# Patient Record
Sex: Male | Born: 1955 | Race: White | Hispanic: No | Marital: Married | Smoking: Current every day smoker
Health system: Southern US, Community
[De-identification: ages and names within clinical notes are randomized; demographics above are authoritative.]

## PROBLEM LIST (undated history)

## (undated) DIAGNOSIS — M1990 Unspecified osteoarthritis, unspecified site: Secondary | ICD-10-CM

## (undated) DIAGNOSIS — R5383 Other fatigue: Secondary | ICD-10-CM

## (undated) DIAGNOSIS — C801 Malignant (primary) neoplasm, unspecified: Secondary | ICD-10-CM

---

## 1977-01-10 HISTORY — PX: FOOT SURGERY: SHX648

## 1979-01-11 HISTORY — PX: FINGER SURGERY: SHX640

## 1992-01-11 HISTORY — PX: VASECTOMY: SHX75

## 2001-12-14 ENCOUNTER — Emergency Department (HOSPITAL_COMMUNITY): Admission: EM | Admit: 2001-12-14 | Discharge: 2001-12-14 | Payer: Self-pay | Admitting: Emergency Medicine

## 2004-01-11 DIAGNOSIS — Z87442 Personal history of urinary calculi: Secondary | ICD-10-CM

## 2004-01-11 HISTORY — DX: Personal history of urinary calculi: Z87.442

## 2005-04-04 ENCOUNTER — Encounter: Admission: RE | Admit: 2005-04-04 | Discharge: 2005-04-04 | Payer: Self-pay | Admitting: Family Medicine

## 2005-04-07 ENCOUNTER — Encounter: Admission: RE | Admit: 2005-04-07 | Discharge: 2005-04-07 | Payer: Self-pay | Admitting: Family Medicine

## 2007-01-22 ENCOUNTER — Ambulatory Visit: Payer: Self-pay | Admitting: Gastroenterology

## 2007-02-05 ENCOUNTER — Ambulatory Visit: Payer: Self-pay | Admitting: Gastroenterology

## 2007-02-05 ENCOUNTER — Encounter: Payer: Self-pay | Admitting: Gastroenterology

## 2007-04-30 IMAGING — CR DG LUMBAR SPINE COMPLETE 4+V
5 series · 5 of 5 positions shown · non-contrast
Comparison: None.

CLINICAL DATA: Back and bilateral hip pain, worse on the right.  No acute injury.
 LUMBAR SPINE, FOUR VIEWS:

[view not recorded (1 of 5)]
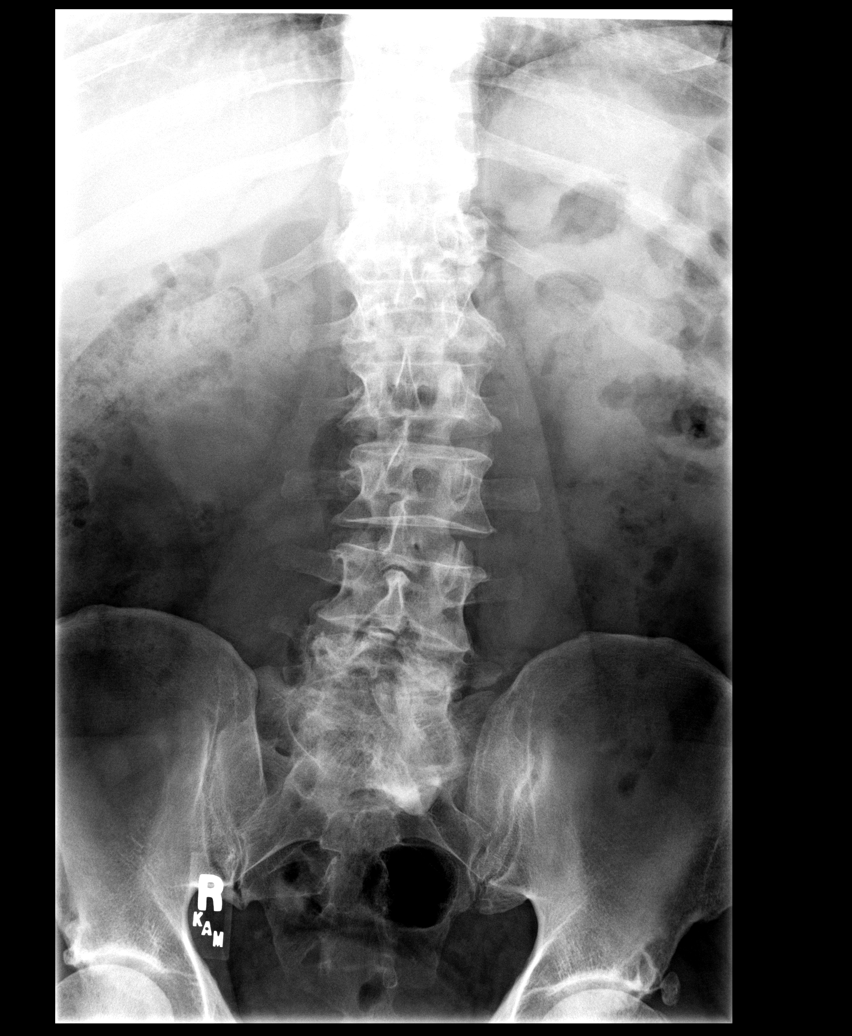

[view not recorded (2 of 5)]
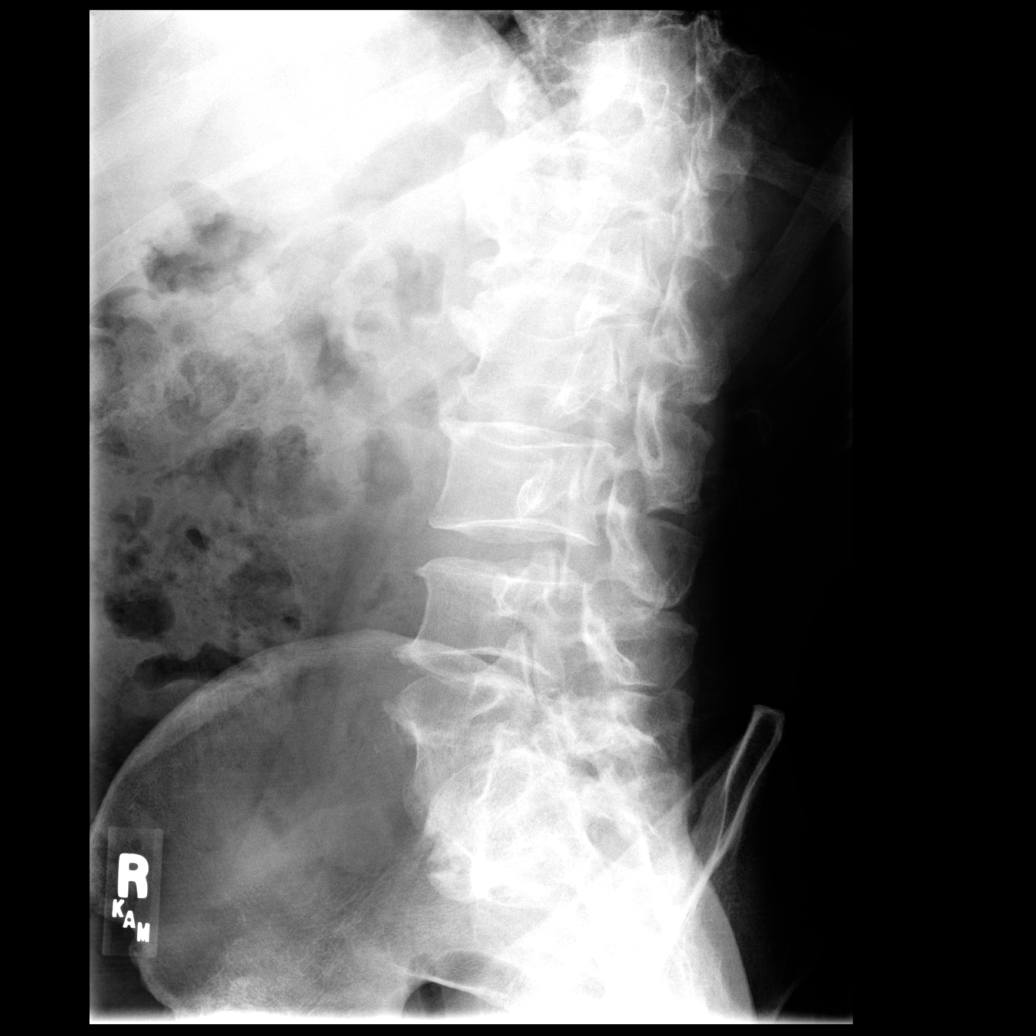

[view not recorded (3 of 5)]
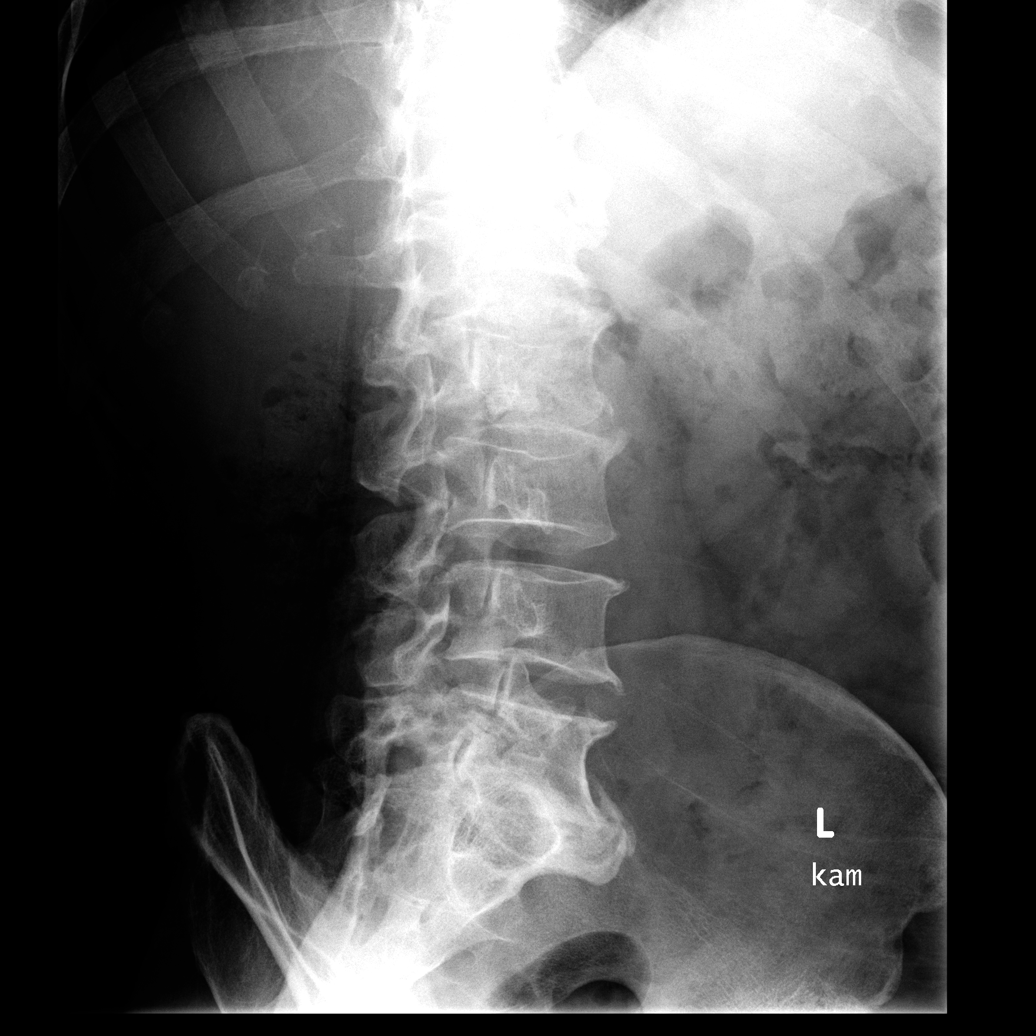

[view not recorded (4 of 5)]
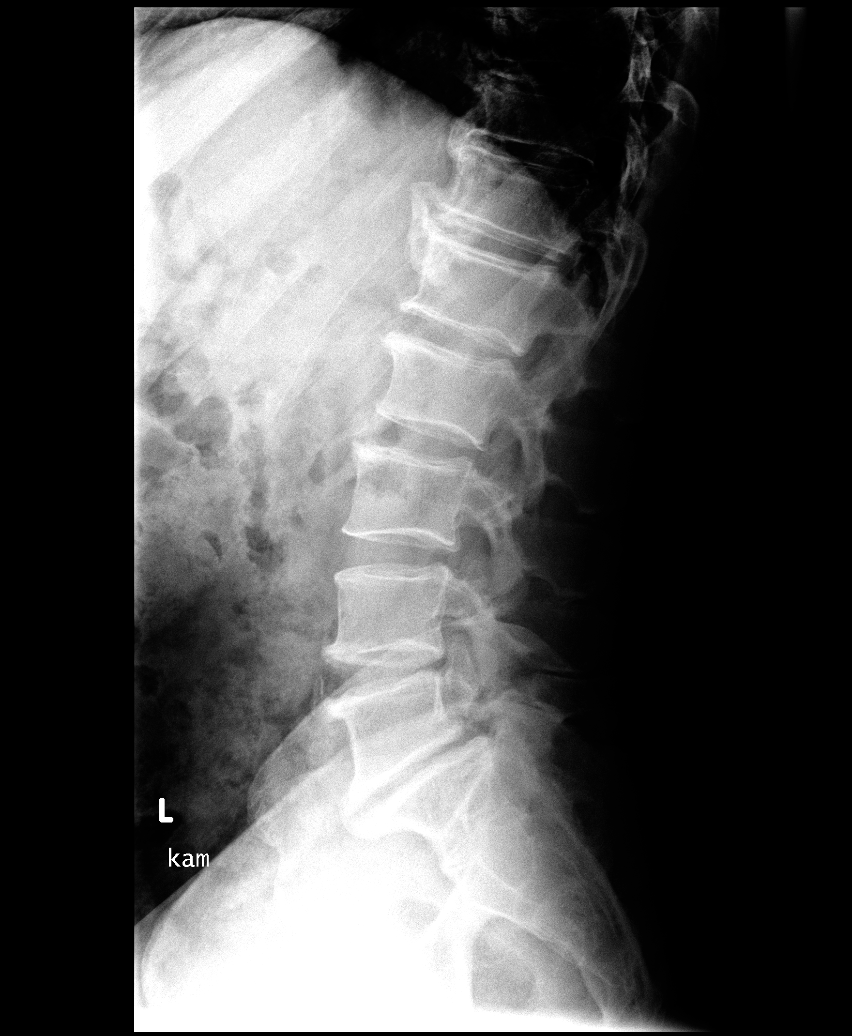

[view not recorded (5 of 5)]
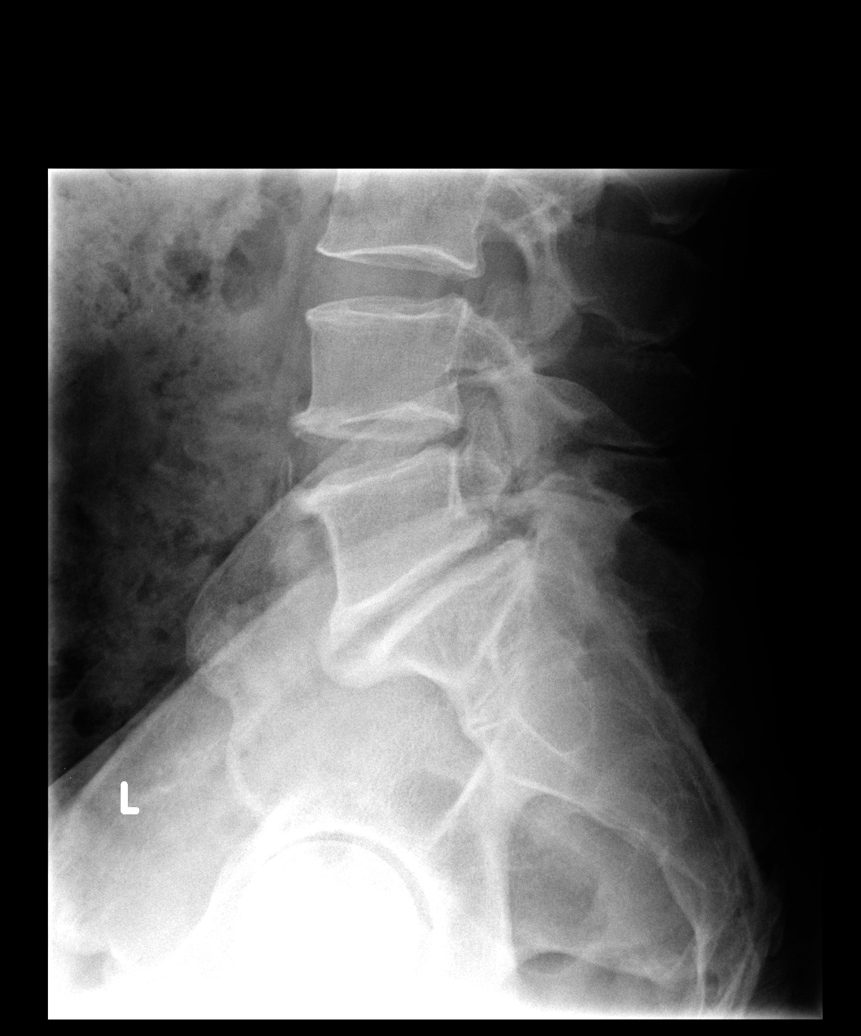

[5 of 5 positions shown; findings below may reference images not displayed]

There are five lumbar type vertebral bodies demonstrating a convex left scoliosis centered at L3.  Bilateral L5 pars defects result in approximately 7 mm of anterolisthesis at L5-S1.  There is disk space loss at that level.  Mild intervertebral spurring is present at the L2-3 and L4-5 levels.  There are paravertebral osteophytes in the lower thoracic spine.  No acute osseous findings are seen.
IMPRESSION: Bilateral L5 pars defects with associated anterolisthesis, as described.  No acute osseous findings.
 RIGHT HIP, TWO VIEWS:
 There is no evidence of acute fracture or dislocation.  Mild joint space loss is present with subchondral cystic change in the superior acetabulum.  Some cystic change is also noted laterally in the femoral neck.  This suggests the possibility of underlying femoral acetabular impingement.
IMPRESSION: No acute findings.  There are degenerative changes suggesting underlying femoral acetabular impingement. 
 LEFT HIP, TWO VIEWS:
 There are similar degenerative changes on the left suggesting femoral acetabular impingement.   An accessory ossicle is noted lateral to the acetabulum.  No acute fracture or dislocation is seen.  There is no evidence of femoral head osteonecrosis.
IMPRESSION: No acute findings.  Early degenerative changes suggesting underlying femoral acetabular impingement.  Consider hip MRI for further evaluation.

## 2007-04-30 IMAGING — CR DG HIP (WITH OR WITHOUT PELVIS) 2-3V*L*
2 series · 2 of 2 positions shown · non-contrast
Comparison: None.

CLINICAL DATA: Back and bilateral hip pain, worse on the right.  No acute injury.
 LUMBAR SPINE, FOUR VIEWS:

[view not recorded (1 of 2)]
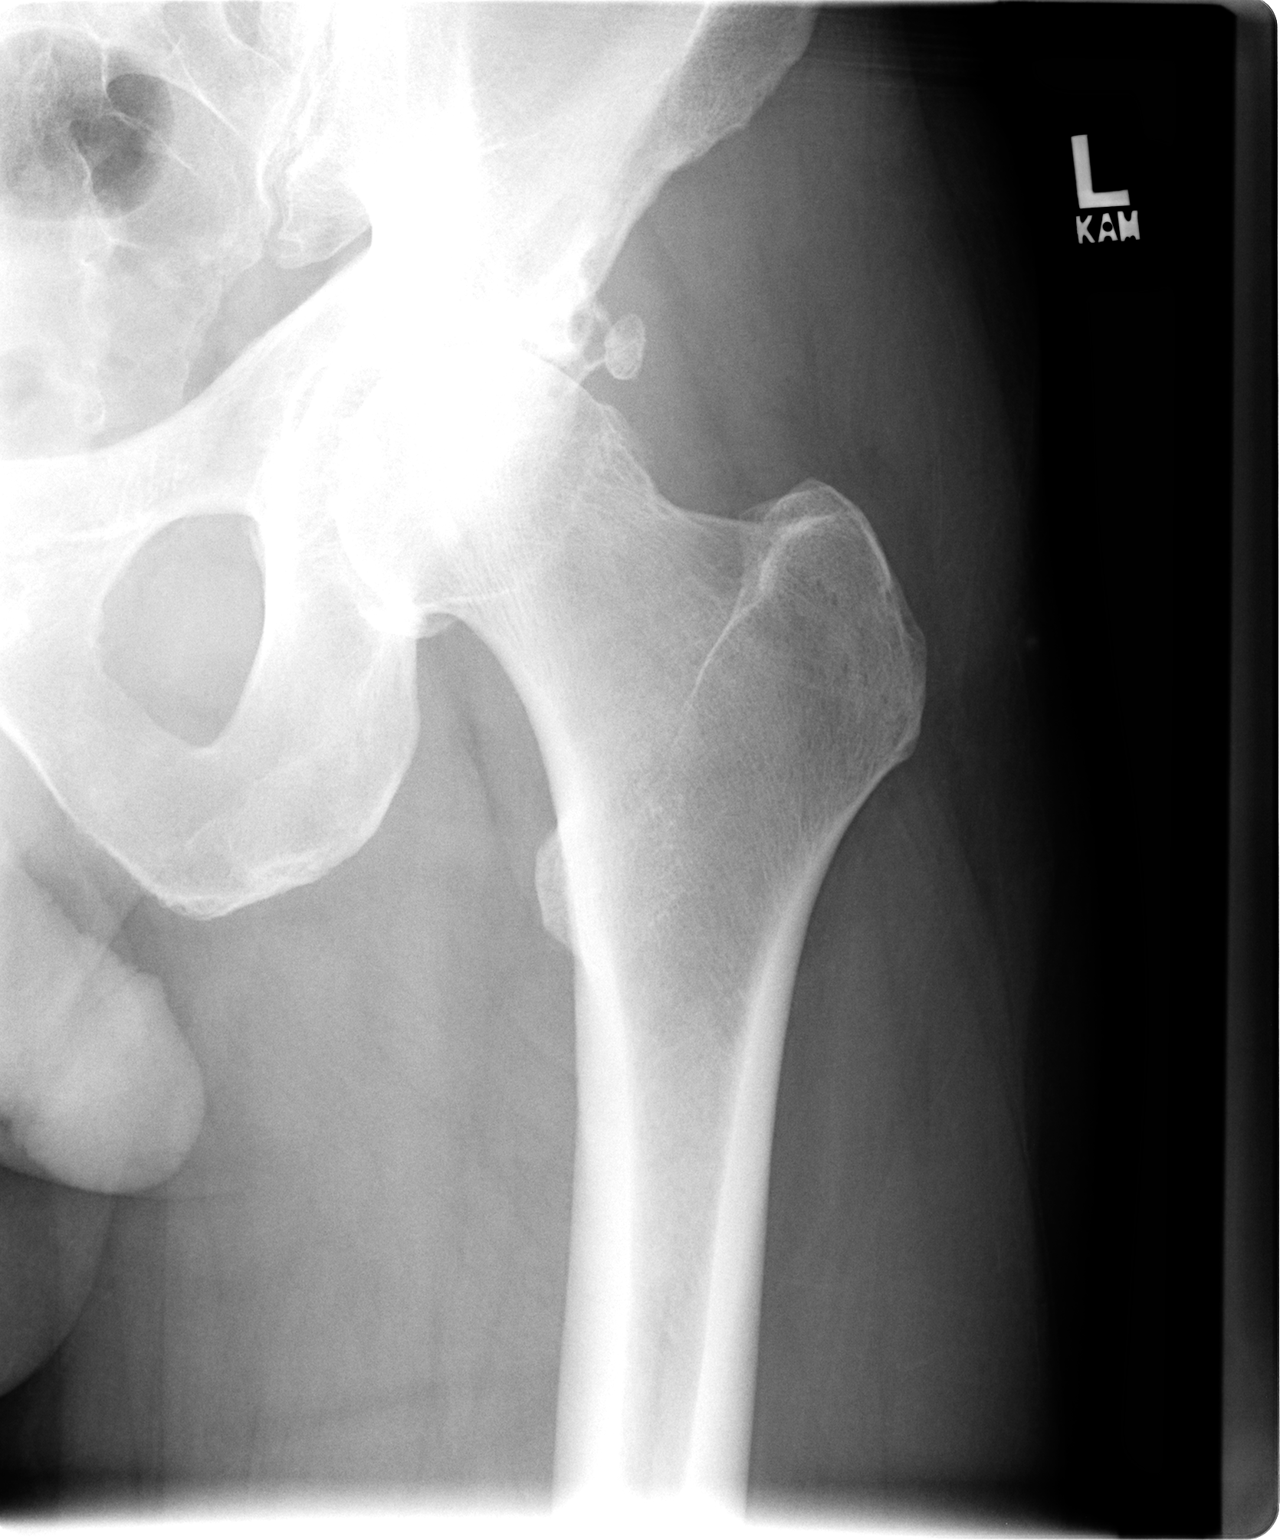

[view not recorded (2 of 2)]
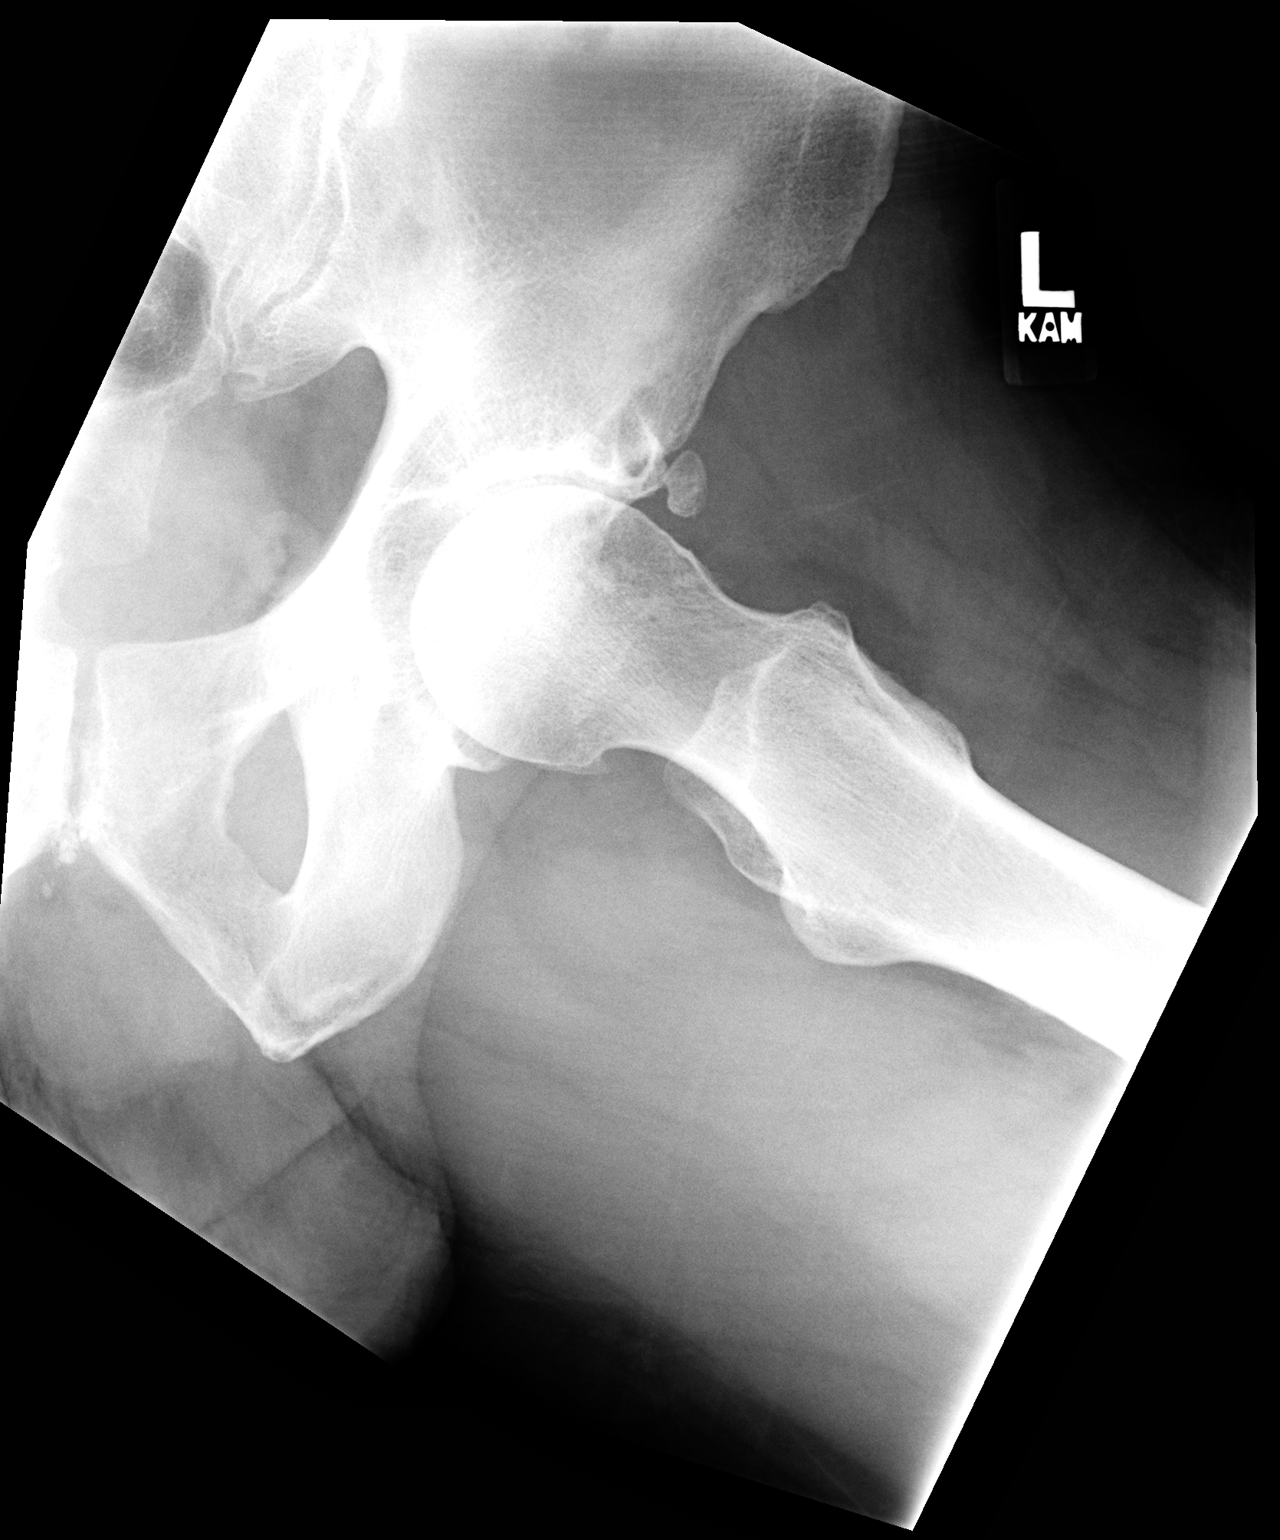

[2 of 2 positions shown; findings below may reference images not displayed]

There are five lumbar type vertebral bodies demonstrating a convex left scoliosis centered at L3.  Bilateral L5 pars defects result in approximately 7 mm of anterolisthesis at L5-S1.  There is disk space loss at that level.  Mild intervertebral spurring is present at the L2-3 and L4-5 levels.  There are paravertebral osteophytes in the lower thoracic spine.  No acute osseous findings are seen.
IMPRESSION: Bilateral L5 pars defects with associated anterolisthesis, as described.  No acute osseous findings.
 RIGHT HIP, TWO VIEWS:
 There is no evidence of acute fracture or dislocation.  Mild joint space loss is present with subchondral cystic change in the superior acetabulum.  Some cystic change is also noted laterally in the femoral neck.  This suggests the possibility of underlying femoral acetabular impingement.
IMPRESSION: No acute findings.  There are degenerative changes suggesting underlying femoral acetabular impingement. 
 LEFT HIP, TWO VIEWS:
 There are similar degenerative changes on the left suggesting femoral acetabular impingement.   An accessory ossicle is noted lateral to the acetabulum.  No acute fracture or dislocation is seen.  There is no evidence of femoral head osteonecrosis.
IMPRESSION: No acute findings.  Early degenerative changes suggesting underlying femoral acetabular impingement.  Consider hip MRI for further evaluation.

## 2011-11-30 ENCOUNTER — Encounter: Payer: Self-pay | Admitting: Gastroenterology

## 2012-09-06 ENCOUNTER — Encounter: Payer: Self-pay | Admitting: Gastroenterology

## 2013-10-09 ENCOUNTER — Encounter: Payer: Self-pay | Admitting: Podiatry

## 2013-10-09 ENCOUNTER — Ambulatory Visit (INDEPENDENT_AMBULATORY_CARE_PROVIDER_SITE_OTHER): Payer: 59 | Admitting: Podiatry

## 2013-10-09 ENCOUNTER — Ambulatory Visit (INDEPENDENT_AMBULATORY_CARE_PROVIDER_SITE_OTHER): Payer: 59

## 2013-10-09 VITALS — BP 147/89 | HR 96 | Resp 12

## 2013-10-09 DIAGNOSIS — R52 Pain, unspecified: Secondary | ICD-10-CM

## 2013-10-09 DIAGNOSIS — M6740 Ganglion, unspecified site: Secondary | ICD-10-CM

## 2013-10-09 NOTE — Progress Notes (Signed)
Subjective:    Patient ID: Lucas Butler, male    DOB: Jan 23, 1955, 58 y.o.   MRN: [id number]  HPI  N-SWOLLEN L-RT TOP OF THE FOOT AND 3RD TOE D-4 WEEKS O-SUDDENLY C-WORSE A-NONE T-NONE  Patient presents for evaluation of soft tissue lesion dorsum of right foot proximally 4-6 weeks duration with slight increase in size and a skin lesion the third right toe present approximately 4-6 weeks without increase in size. There is no previous history or treatment for these problems.  Patient also mentions that in the past he's worn rigid foot orthotics that provided him a generalized relief of lower extremity musculoskeletal pain. More recently he is wearing soft over-the-counter supports with a metatarsal raise that provides some relief of his generalized musculoskeletal pain, however, he believes that the more rigid orthotics may have provided him slightly more relief . He describes the orthotics is rigid, however, the plastic broke and he discolored the orthotics to  Review of Systems  All other systems reviewed and are negative.      Objective:   Physical Exam  Orientated x3 white male  Vascular: DP and PT pulses 2/4 bilaterally  Neurological: Sensation to 10 g monofilament wire intact 5/5 bilaterally Ankle reflex equal and reactive bilaterally  Dermatological: Surgical scars noted on the dorsal first MPJ and second MPJ bilaterally Texture and turgor within normal limits A raise translucent 5 mm skin lesion is noted on the dorsal aspect of the third toe right foot  Musculoskeletal: There is a palpable soft tissue mass on the dorsum of the right foot the third metatarsal area that is not adherent to the overlying skin and is freely mobile. The lesion is approximately 10 by 15 mm. The lesion is noticeable by observation without any edema dorsally, however, is palpable as described as above.  HAV deformities noted bilaterally Medial longitudinal arch noted upon standing  bilaterally Heel inverts when he stands on toes bilaterally There is no restriction ankle, subtalar, midtarsal joints bilaterally  X-ray examination weightbearing right foot  Intact bony structure without fracture and/or dislocation noted  Surgical resection medial condyle of first metatarsal  Metatarsus adductus  HAV deformity  Inferior calcaneal spur  Hammertoe deformity second  No increased soft tissue density noted over dorsum of left foot  Radiographic impression:  No acute bony abnormality noted in the right foot      Assessment & Plan:   Assessment: Satisfactory neurovascular status Well-healed surgical scar scars noted bilaterally Suspect ganglionic cyst dorsal aspect of right foot Mucoid cyst, third toe right foot Generalized musculoskeletal complaints associated with overpronation  Plan: Offered patient Kenalog injection into the suspect ganglionic and cyst on the third right toe and he verbally consents.  The two soft tissue lesions on the right foot are prepped with alcohol and Betadine. The soft tissue lesion on the third metatarsal area and the third right toe were aspirated and injected with Kenalog 10 mg, 10 milligrams a plain Xylocaine and 2.5 mg of plain Marcaine with the majority of the Kenalog into the dorsal third metatarsal soft tissue lesion. There was no obvious reduction in the size of the mass the third right metatarsal after aspiration and injection. Manual compression on the third metatarsal soft tissue lesion seen to reduce the size, however, no fluid was aspirated or herniated in the aspiration puncture site and the soft tissue lesion the third right metatarsal. The soft tissue lesion in the third right toe reduced immediately after aspiration with a clear synovial-like  fluid expressed.  Compression dressings were applied to both sites. I will reevaluate soft tissue lesions in the next 6 weeks to check response from Kenalog injections.  Also, I will  discuss patient's generalized musculoskeletal complaint at his next visit

## 2013-10-09 NOTE — Patient Instructions (Signed)

## 2013-10-10 MED ORDER — TRIAMCINOLONE ACETONIDE 10 MG/ML IJ SUSP
10.0000 mg | Freq: Once | INTRAMUSCULAR | Status: AC
  Administered 2013-10-10: 10 mg

## 2013-11-20 ENCOUNTER — Ambulatory Visit (INDEPENDENT_AMBULATORY_CARE_PROVIDER_SITE_OTHER): Payer: 59 | Admitting: Podiatry

## 2013-11-20 ENCOUNTER — Encounter: Payer: Self-pay | Admitting: Podiatry

## 2013-11-20 VITALS — BP 136/84 | HR 82 | Resp 12

## 2013-11-20 DIAGNOSIS — D492 Neoplasm of unspecified behavior of bone, soft tissue, and skin: Secondary | ICD-10-CM

## 2013-11-20 NOTE — Progress Notes (Signed)
Patient ID: Lucas Butler, male   DOB: 1955/07/28, 58 y.o.   MRN: [id number]  Subjective: This patient presents for follow-up care from the visit of 10/10/2013. Kenalog injections were given the soft tissue lesion on the dorsum of the right foot and third toe right foot. Patient noticed the lesion addressed in the right foot is still present without any pain or increase in size that he noticed.  Objective: Orientated 3 The dorsal aspect of right foot overlying the third metatarsal is a palpable soft tissue lesion 1.5 cm x 2.5 cm that has a rubbery consistency and is not adherent the overlying skin. Patient is able to dorsi and plantarflex toes on the right foot.  The system the distal third right toe is not present  Assessment:  soft tissue tumor dorsal aspect third right metatarsal Resolved slight cyst third right toe  Plan: I had a discussion with patient today make him aware that the soft tissue lesion on the third right metatarsal area was still present and did not respond to Kenalog injection. At this time I discussed treatment options including observation and possible surgical excision. I am recommending that he returns in the next 3 months to further evaluate this lesion, or sooner if he has concern :

## 2014-02-19 ENCOUNTER — Encounter: Payer: Self-pay | Admitting: Podiatry

## 2014-02-19 ENCOUNTER — Ambulatory Visit (INDEPENDENT_AMBULATORY_CARE_PROVIDER_SITE_OTHER): Payer: 59 | Admitting: Podiatry

## 2014-02-19 VITALS — BP 144/91 | HR 88 | Resp 16

## 2014-02-19 DIAGNOSIS — D492 Neoplasm of unspecified behavior of bone, soft tissue, and skin: Secondary | ICD-10-CM

## 2014-02-19 NOTE — Progress Notes (Signed)
Patient ID: Lucas Butler, male   DOB: 1955/09/15, 59 y.o.   MRN: [id number]  Subjective: This patient presents for follow-up visit for a soft tissue lesion on the dorsum of right foot. He initially presented for this problem on 10/09/2013. The lesion has been present since approximately August 2015. A Kenalog injection was given is a soft tissue lesion on the dorsum the right foot on the visit of 10/09/2013. The lesion has not reduced in size since the initial injection.  Objective: Orientated 3 A palpable rubbery soft tissue mass not adherent to the overlying skin measuring 15 x 30 mm on the dorsal bases of the third and fourth toes on the right foot.  Assessment: Persistence of soft tissue mass dorsum of right foot  Plan: At this time as lesion has persistent and has not reduced in size post injection recommending patient consider possible excision and biopsy the lesion. In general I made him aware because he weightbearing occupation time off work was estimated approximately 6- weeks.  He would like to consider possible excision of this lesion and I'm referring him to Dr. Earleen Newport for further evaluation and treatment as Dr. Earleen Newport and Mr. Criswell agreed to

## 2014-02-19 NOTE — Patient Instructions (Signed)
I am referring you to Dr. Earleen Newport to evaluate the soft tissue growth on the right foot and with consideration of possible surgical treatment

## 2014-03-05 ENCOUNTER — Encounter: Payer: Self-pay | Admitting: Podiatry

## 2014-03-05 ENCOUNTER — Ambulatory Visit (INDEPENDENT_AMBULATORY_CARE_PROVIDER_SITE_OTHER): Payer: 59 | Admitting: Podiatry

## 2014-03-05 VITALS — BP 141/83 | HR 95 | Resp 18

## 2014-03-05 DIAGNOSIS — M6740 Ganglion, unspecified site: Secondary | ICD-10-CM

## 2014-03-06 ENCOUNTER — Encounter: Payer: Self-pay | Admitting: Podiatry

## 2014-03-06 NOTE — Progress Notes (Signed)
Patient ID: Lucas Butler, male   DOB: Dec 02, 1955, 59 y.o.   MRN: [id number]  Subjective:  59 year old male presents the office they for surgical consultation of a soft tissue mass on the dorsal aspect of his right foot. He states the mass his been present since October. He states that he has no pain associated with the mass and he states that he does not believe his changed significantly in size. He denies any overlying skin changes. No other complaints at this time. No acute changes since last appointment.  Objective: AAO 3, NAD DP/PT pulses palpable, CRT less than 3 seconds Protective sensation intact with Simms Weinstein monofilament, vibratory sensation intact, Achilles tendon reflex intact. On the dorsal aspect the right foot on the distal fourth metatarsal there is a soft, mobile, fluid-filled soft tissue mass measuring 1.8 x 1.6 cm. The mass appears to be directly underneath the extensor digitorum longus tendon to the fourth toe. There is no overlying erythema or any other skin changes. There is no tender to palpation overlying lesion at this time. No other lesions identified bilaterally. No other areas of tenderness to bilateral lower extremities. MMT 5/5, ROM WNL No open lesions or pre-ulcerative lesions bilaterally. No pain with calf compression, swelling, warmth, erythema.  Assessment: 59 year old male with a symptomatic right foot soft tissue mass, likely ganglion cyst.  Plan: Treatment options both conservative and surgical were discussed with the patient including alternatives, risks, complications. I discussed with the patient surgical incision and biopsy of the lesion which included incision placement as well as postoperative course. At this time the patient states the lesion does not bother him and he is unable to take time off of work for surgery at this time. He wishes to continue to monitor the area for any change. Discussed with him that if the area come symptomatic, increased  in size to call the office. In the meantime, encourage call the office with any questions, concerns, change in symptoms.

## 2014-09-29 ENCOUNTER — Encounter: Payer: Self-pay | Admitting: Gastroenterology

## 2019-01-11 HISTORY — PX: CATARACT EXTRACTION: SUR2

## 2019-09-11 DIAGNOSIS — H11041 Peripheral pterygium, stationary, right eye: Secondary | ICD-10-CM | POA: Diagnosis not present

## 2019-09-11 DIAGNOSIS — H40013 Open angle with borderline findings, low risk, bilateral: Secondary | ICD-10-CM | POA: Diagnosis not present

## 2019-09-11 DIAGNOSIS — H2512 Age-related nuclear cataract, left eye: Secondary | ICD-10-CM | POA: Diagnosis not present

## 2019-09-11 DIAGNOSIS — H2181 Floppy iris syndrome: Secondary | ICD-10-CM | POA: Diagnosis not present

## 2019-12-10 DIAGNOSIS — Z20822 Contact with and (suspected) exposure to covid-19: Secondary | ICD-10-CM | POA: Diagnosis not present

## 2019-12-20 DIAGNOSIS — Z20822 Contact with and (suspected) exposure to covid-19: Secondary | ICD-10-CM | POA: Diagnosis not present

## 2019-12-20 DIAGNOSIS — Z03818 Encounter for observation for suspected exposure to other biological agents ruled out: Secondary | ICD-10-CM | POA: Diagnosis not present

## 2020-01-09 DIAGNOSIS — Z20822 Contact with and (suspected) exposure to covid-19: Secondary | ICD-10-CM | POA: Diagnosis not present

## 2020-07-08 DIAGNOSIS — C4441 Basal cell carcinoma of skin of scalp and neck: Secondary | ICD-10-CM | POA: Diagnosis not present

## 2020-07-08 DIAGNOSIS — D1801 Hemangioma of skin and subcutaneous tissue: Secondary | ICD-10-CM | POA: Diagnosis not present

## 2020-07-08 DIAGNOSIS — D225 Melanocytic nevi of trunk: Secondary | ICD-10-CM | POA: Diagnosis not present

## 2020-07-08 DIAGNOSIS — L570 Actinic keratosis: Secondary | ICD-10-CM | POA: Diagnosis not present

## 2020-07-08 DIAGNOSIS — L821 Other seborrheic keratosis: Secondary | ICD-10-CM | POA: Diagnosis not present

## 2020-07-08 DIAGNOSIS — L720 Epidermal cyst: Secondary | ICD-10-CM | POA: Diagnosis not present

## 2020-07-08 DIAGNOSIS — D2261 Melanocytic nevi of right upper limb, including shoulder: Secondary | ICD-10-CM | POA: Diagnosis not present

## 2020-08-05 DIAGNOSIS — C44319 Basal cell carcinoma of skin of other parts of face: Secondary | ICD-10-CM | POA: Diagnosis not present

## 2020-09-16 DIAGNOSIS — H2181 Floppy iris syndrome: Secondary | ICD-10-CM | POA: Diagnosis not present

## 2020-09-16 DIAGNOSIS — H2512 Age-related nuclear cataract, left eye: Secondary | ICD-10-CM | POA: Diagnosis not present

## 2020-09-16 DIAGNOSIS — H11041 Peripheral pterygium, stationary, right eye: Secondary | ICD-10-CM | POA: Diagnosis not present

## 2020-09-16 DIAGNOSIS — H40003 Preglaucoma, unspecified, bilateral: Secondary | ICD-10-CM | POA: Diagnosis not present

## 2020-09-29 DIAGNOSIS — C44321 Squamous cell carcinoma of skin of nose: Secondary | ICD-10-CM | POA: Diagnosis not present

## 2022-04-05 ENCOUNTER — Ambulatory Visit (HOSPITAL_COMMUNITY): Payer: Self-pay | Admitting: Emergency Medicine

## 2022-04-05 DIAGNOSIS — G8929 Other chronic pain: Secondary | ICD-10-CM

## 2022-04-05 NOTE — Patient Instructions (Addendum)
DUE TO COVID-19 ONLY TWO VISITORS  (aged 68 and older)  ARE ALLOWED TO COME WITH YOU AND STAY IN THE WAITING ROOM ONLY DURING PRE OP AND PROCEDURE.   **NO VISITORS ARE ALLOWED IN THE SHORT STAY AREA OR RECOVERY ROOM!!**  IF YOU WILL BE ADMITTED INTO THE HOSPITAL YOU ARE ALLOWED ONLY FOUR SUPPORT PEOPLE DURING VISITATION HOURS ONLY (7 AM -8PM)   The support person(s) must pass our screening, gel in and out, and wear a mask at all times, including in the patient's room. Patients must also wear a mask when staff or their support person are in the room. Visitors GUEST BADGE MUST BE WORN VISIBLY  One adult visitor may remain with you overnight and MUST be in the room by 8 P.M.     Your procedure is scheduled on: 4./15/24   Report to [facility] Entrance    Report to admitting at  5:15 AM   Call this number if you have problems the morning of surgery [phone]   Do not eat food :After Midnight.   After Midnight you may have the following liquids until _4:15_____ AM/  DAY OF SURGERY  Water Black Coffee (sugar ok, NO MILK/CREAM OR CREAMERS)  Tea (sugar ok, NO MILK/CREAM OR CREAMERS) regular and decaf                             Plain Jell-O (NO RED)                                           Fruit ices (not with fruit pulp, NO RED)                                     Popsicles (NO RED)                                                                  Juice: apple, WHITE grape, WHITE cranberry Sports drinks like Gatorade (NO RED)                   The day of surgery:  Drink ONE (1) Pre-Surgery Clear Ensure at  4:00 AM the morning of surgery. Drink in one sitting. Do not sip.  This drink was given to you during your hospital  pre-op appointment visit. Nothing else to drink after completing the  Pre-Surgery Clear Ensure at 4:15 AM          If you have questions, please contact your surgeon's office.      Oral Hygiene is also important to reduce your risk of infection.                                     Remember - BRUSH YOUR TEETH THE MORNING OF SURGERY WITH YOUR REGULAR TOOTHPASTE  DENTURES WILL BE REMOVED PRIOR TO SURGERY PLEASE DO NOT APPLY "Poly grip" OR ADHESIVES!!!      Take these medicines the  morning of surgery with A SIP OF WATER: none                                You may not have any metal on your body including  jewelry, and body piercing             Do not wear  lotions, powders, cologne, or deodorant                Men may shave face and neck.   Do not bring valuables to the hospital. [place].   Contacts, glasses, or bridgework may not be worn into surgery.   DO NOT [place].   Patients discharged on the day of surgery will not be allowed to drive home.  Someone NEEDS to stay with you for the first 24 hours after anesthesia.   Special Instructions: Bring a copy of your healthcare power of attorney and living will documents   the day of surgery if you haven't scanned them before.              Please read over the following fact sheets you were given: IF YOU HAVE QUESTIONS ABOUT YOUR PRE-OP INSTRUCTIONS PLEASE CALL [phone]    [facility] - Preparing for Surgery Before surgery, you can play an important role.  Because skin is not sterile, your skin needs to be as free of germs as possible.  You can reduce the number of germs on your skin by washing with CHG (chlorahexidine gluconate) soap before surgery.  CHG is an antiseptic cleaner which kills germs and bonds with the skin to continue killing germs even after washing. Please DO NOT use if you have an allergy to CHG or antibacterial soaps.  If your skin becomes reddened/irritated stop using the CHG and inform your nurse when you arrive at Short Stay..  You may shave your face/neck. Please follow these instructions carefully:  1.  Shower with CHG Soap  3 days before surgery ,the night before  surgery and the  morning of Surgery.  2.  If you choose to wash your hair, wash your hair first as usual with your  normal  shampoo.  3.  After you shampoo, rinse your hair and body thoroughly to remove the  shampoo.                            4.  Use CHG as you would any other liquid soap.  You can apply chg directly  to the skin and wash                       Gently with a scrungie or clean washcloth.  5.  Apply the CHG Soap to your body ONLY FROM THE NECK DOWN.   Do not use on face/ open                           Wound or open sores. Avoid contact with eyes, ears mouth and genitals (private parts).                       Wash face,  Genitals (private parts) with your normal soap.  6.  Wash thoroughly, paying special attention to the area where your surgery  will be performed.  7.  Thoroughly rinse your body with warm water from the neck down.  8.  DO NOT shower/wash with your normal soap after using and rinsing off  the CHG Soap.             9.  Pat yourself dry with a clean towel.            10.  Wear clean pajamas.            11.  Place clean sheets on your bed the night of your first shower and do not  sleep with pets. Day of Surgery : Do not apply any lotions/deodorants the morning of surgery.  Please wear clean clothes to the hospital/surgery center.  FAILURE TO FOLLOW THESE INSTRUCTIONS MAY RESULT IN THE CANCELLATION OF YOUR SURGERY  PATIENT SIGNATURE_________________________________    ________________________________________________________________________  Adam [place]ix  An incentive spirometer is a tool that can help keep your lungs clear and active. This tool measures how well you are filling your lungs with each breath. Taking long deep breaths may help reverse or decrease the chance of developing breathing (pulmonary) problems (especially infection) following: A long period of time when you are unable to move or be active. BEFORE THE PROCEDURE  If the  spirometer includes an indicator to show your best effort, your nurse or respiratory therapist will set it to a desired goal. If possible, sit up straight or lean slightly forward. Try not to slouch. Hold the incentive spirometer in an upright position. INSTRUCTIONS FOR USE  Sit on the edge of your bed if possible, or sit up as far as you can in bed or on a chair. Hold the incentive spirometer in an upright position. Breathe out normally. Place the mouthpiece in your mouth and seal your lips tightly around it. Breathe in slowly and as deeply as possible, raising the piston or the ball toward the top of the column. Hold your breath for 3-5 seconds or for as long as possible. Allow the piston or ball to fall to the bottom of the column. Remove the mouthpiece from your mouth and breathe out normally. Rest for a few seconds and repeat Steps 1 through 7 at least 10 times every 1-2 hours when you are awake. Take your time and take a few normal breaths between deep breaths. The spirometer may include an indicator to show your best effort. Use the indicator as a goal to work toward during each repetition. After each set of 10 deep breaths, practice coughing to be sure your lungs are clear. If you have an incision (the cut made at the time of surgery), support your incision when coughing by placing a pillow or rolled up towels firmly against it. Once you are able to get out of bed, walk around indoors and cough well. You may stop using the incentive spirometer when instructed by your caregiver.  RISKS AND COMPLICATIONS Take your time so you do not get dizzy or light-headed. If you are in pain, you may need to take or ask for pain medication before doing incentive spirometry. It is harder to take a deep breath if you are having pain. AFTER USE Rest and breathe slowly and easily. It can be helpful to keep track of a log of your progress. Your caregiver can provide you with a simple table to help with  this. If you are using the spirometer at  home, follow these instructions: [place] IF:  You are having difficultly using the spirometer. You have trouble using the spirometer as often as instructed. Your pain medication is not giving enough relief while using the spirometer. You develop fever of 100.5 F (38.1 C) or higher. SEEK IMMEDIATE MEDICAL CARE IF:  You cough up bloody sputum that had not been present before. You develop fever of 102 F (38.9 C) or greater. You develop worsening pain at or near the incision site. MAKE SURE YOU:  Understand these instructions. Will watch your condition. Will get help right away if you are not doing well or get worse. Document Released: 05/09/2006 Document Revised: 03/21/2011 Document Reviewed: 07/10/2006 [facility] Patient Information 2014 [place], [place].   ________________________________________________________________________

## 2022-04-05 NOTE — H&P (View-Only) (Signed)
TOTAL HIP ADMISSION H&P  Patient is admitted for right total hip arthroplasty.  Subjective:  Chief Complaint: right hip pain  HPI: Lucas Butler, 67 y.o. male, has a history of pain and functional disability in the right hip(s) due to arthritis and patient has failed non-surgical conservative treatments for greater than 12 weeks to include NSAID's and/or analgesics, flexibility and strengthening excercises, use of assistive devices, and activity modification.  Onset of symptoms was gradual starting >10 years ago with gradually worsening course since that time.The patient noted no past surgery on the right hip(s).  Patient currently rates pain in the right hip at 8 out of 10 with activity. Patient has night pain, worsening of pain with activity and weight bearing, and pain that interfers with activities of daily living. There is no current active infection.  There are no problems to display for this patient.  No past medical history on file.  No past surgical history on file.  No current outpatient medications on file.   No current facility-administered medications for this visit.   No Known Allergies  Social History   Tobacco Use   Smoking status: Every Day   Smokeless tobacco: Not on file  Substance Use Topics   Alcohol use: Yes    No family history on file.   Review of Systems  Musculoskeletal:  Positive for arthralgias.  All other systems reviewed and are negative.   Objective:  Physical Exam Constitutional:      General: He is not in acute distress.    Appearance: Normal appearance. He is normal weight.  HENT:     Head: Normocephalic and atraumatic.  Eyes:     Extraocular Movements: Extraocular movements intact.     Conjunctiva/sclera: Conjunctivae normal.     Pupils: Pupils are equal, round, and reactive to light.  Cardiovascular:     Rate and Rhythm: Normal rate and regular rhythm.     Pulses: Normal pulses.     Heart sounds: Normal heart sounds.  Pulmonary:      Effort: Pulmonary effort is normal. No respiratory distress.     Breath sounds: Normal breath sounds.  Abdominal:     General: Bowel sounds are normal. There is no distension.     Palpations: Abdomen is soft.     Tenderness: There is no abdominal tenderness.  Musculoskeletal:        General: Tenderness present.     Cervical back: Normal range of motion and neck supple.     Comments: Right hip with bony tenderness and tenderness elicited with ROM.  Limited internal and external rotation.  Without bony deformity or malrotation.  No significant swelling or warmth to joint.  Lymphadenopathy:     Cervical: No cervical adenopathy.  Skin:    General: Skin is warm and dry.     Capillary Refill: Capillary refill takes less than 2 seconds.     Findings: No erythema or rash.  Neurological:     General: No focal deficit present.     Mental Status: He is alert and oriented to person, place, and time.  Psychiatric:        Mood and Affect: Mood normal.        Behavior: Behavior normal.     Vital signs in last 24 hours: @VSRANGES@  Labs:   There is no height or weight on file to calculate BMI.   Imaging Review Plain radiographs demonstrate severe degenerative joint disease of the bilateral hip(s). The bone quality appears to be   good for age and reported activity level.    Assessment/Plan:  End stage arthritis, right hip(s)  The patient history, physical examination, clinical judgement of the provider and imaging studies are consistent with end stage degenerative joint disease of the right hip(s) and total hip arthroplasty is deemed medically necessary. The treatment options including medical management, injection therapy, arthroscopy and arthroplasty were discussed at length. The risks and benefits of total hip arthroplasty were presented and reviewed. The risks due to aseptic loosening, infection, stiffness, dislocation/subluxation,  thromboembolic complications and other imponderables  were discussed.  The patient acknowledged the explanation, agreed to proceed with the plan and consent was signed. Patient is being admitted for inpatient treatment for surgery, pain control, PT, OT, prophylactic antibiotics, VTE prophylaxis, progressive ambulation and ADL's and discharge planning.The patient is planning to be discharged home with outpatient PT  

## 2022-04-05 NOTE — H&P (Signed)
TOTAL HIP ADMISSION H&P  Patient is admitted for right total hip arthroplasty.  Subjective:  Chief Complaint: right hip pain  HPI: Lucas Butler, 67 y.o. male, has a history of pain and functional disability in the right hip(s) due to arthritis and patient has failed non-surgical conservative treatments for greater than 12 weeks to include NSAID's and/or analgesics, flexibility and strengthening excercises, use of assistive devices, and activity modification.  Onset of symptoms was gradual starting >10 years ago with gradually worsening course since that time.The patient noted no past surgery on the right hip(s).  Patient currently rates pain in the right hip at 8 out of 10 with activity. Patient has night pain, worsening of pain with activity and weight bearing, and pain that interfers with activities of daily living. There is no current active infection.  There are no problems to display for this patient.  No past medical history on file.  No past surgical history on file.  No current outpatient medications on file.   No current facility-administered medications for this visit.   No Known Allergies  Social History   Tobacco Use   Smoking status: Every Day   Smokeless tobacco: Not on file  Substance Use Topics   Alcohol use: Yes    No family history on file.   Review of Systems  Musculoskeletal:  Positive for arthralgias.  All other systems reviewed and are negative.   Objective:  Physical Exam Constitutional:      General: He is not in acute distress.    Appearance: Normal appearance. He is normal weight.  HENT:     Head: Normocephalic and atraumatic.  Eyes:     Extraocular Movements: Extraocular movements intact.     Conjunctiva/sclera: Conjunctivae normal.     Pupils: Pupils are equal, round, and reactive to light.  Cardiovascular:     Rate and Rhythm: Normal rate and regular rhythm.     Pulses: Normal pulses.     Heart sounds: Normal heart sounds.  Pulmonary:      Effort: Pulmonary effort is normal. No respiratory distress.     Breath sounds: Normal breath sounds.  Abdominal:     General: Bowel sounds are normal. There is no distension.     Palpations: Abdomen is soft.     Tenderness: There is no abdominal tenderness.  Musculoskeletal:        General: Tenderness present.     Cervical back: Normal range of motion and neck supple.     Comments: Right hip with bony tenderness and tenderness elicited with ROM.  Limited internal and external rotation.  Without bony deformity or malrotation.  No significant swelling or warmth to joint.  Lymphadenopathy:     Cervical: No cervical adenopathy.  Skin:    General: Skin is warm and dry.     Capillary Refill: Capillary refill takes less than 2 seconds.     Findings: No erythema or rash.  Neurological:     General: No focal deficit present.     Mental Status: He is alert and oriented to person, place, and time.  Psychiatric:        Mood and Affect: Mood normal.        Behavior: Behavior normal.     Vital signs in last 24 hours: @VSRANGES @  Labs:   There is no height or weight on file to calculate BMI.   Imaging Review Plain radiographs demonstrate severe degenerative joint disease of the bilateral hip(s). The bone quality appears to be  good for age and reported activity level.    Assessment/Plan:  End stage arthritis, right hip(s)  The patient history, physical examination, clinical judgement of the provider and imaging studies are consistent with end stage degenerative joint disease of the right hip(s) and total hip arthroplasty is deemed medically necessary. The treatment options including medical management, injection therapy, arthroscopy and arthroplasty were discussed at length. The risks and benefits of total hip arthroplasty were presented and reviewed. The risks due to aseptic loosening, infection, stiffness, dislocation/subluxation,  thromboembolic complications and other imponderables  were discussed.  The patient acknowledged the explanation, agreed to proceed with the plan and consent was signed. Patient is being admitted for inpatient treatment for surgery, pain control, PT, OT, prophylactic antibiotics, VTE prophylaxis, progressive ambulation and ADL's and discharge planning.The patient is planning to be discharged home with outpatient PT

## 2022-04-13 ENCOUNTER — Encounter (HOSPITAL_COMMUNITY): Payer: Self-pay

## 2022-04-13 ENCOUNTER — Other Ambulatory Visit: Payer: Self-pay

## 2022-04-13 ENCOUNTER — Encounter (HOSPITAL_COMMUNITY)
Admission: RE | Admit: 2022-04-13 | Discharge: 2022-04-13 | Disposition: A | Discharge status: Home / Self Care | Payer: Medicare Other | Source: Ambulatory Visit | Attending: Orthopedic Surgery | Admitting: Orthopedic Surgery

## 2022-04-13 DIAGNOSIS — M25551 Pain in right hip: Secondary | ICD-10-CM | POA: Diagnosis not present

## 2022-04-13 DIAGNOSIS — G8929 Other chronic pain: Secondary | ICD-10-CM

## 2022-04-13 DIAGNOSIS — Z01818 Encounter for other preprocedural examination: Secondary | ICD-10-CM

## 2022-04-13 DIAGNOSIS — Z01812 Encounter for preprocedural laboratory examination: Secondary | ICD-10-CM | POA: Insufficient documentation

## 2022-04-13 HISTORY — DX: Other fatigue: R53.83

## 2022-04-13 HISTORY — DX: Unspecified osteoarthritis, unspecified site: M19.90

## 2022-04-13 LAB — CBC WITH DIFFERENTIAL/PLATELET
Abs Immature Granulocytes: 0.01 10*3/uL (ref 0.00–0.07)
Basophils Absolute: 0 10*3/uL (ref 0.0–0.1)
Basophils Relative: 1 %
Eosinophils Absolute: 0.1 10*3/uL (ref 0.0–0.5)
Eosinophils Relative: 1 %
HCT: 44.1 % (ref 39.0–52.0)
Hemoglobin: 14.5 g/dL (ref 13.0–17.0)
Immature Granulocytes: 0 %
Lymphocytes Relative: 23 %
Lymphs Abs: 1.6 10*3/uL (ref 0.7–4.0)
MCH: 30.3 pg (ref 26.0–34.0)
MCHC: 32.9 g/dL (ref 30.0–36.0)
MCV: 92.1 fL (ref 80.0–100.0)
Monocytes Absolute: 0.5 10*3/uL (ref 0.1–1.0)
Monocytes Relative: 6 %
Neutro Abs: 4.9 10*3/uL (ref 1.7–7.7)
Neutrophils Relative %: 69 %
Platelets: 210 10*3/uL (ref 150–400)
RBC: 4.79 MIL/uL (ref 4.22–5.81)
RDW: 13.4 % (ref 11.5–15.5)
WBC: 7.2 10*3/uL (ref 4.0–10.5)
nRBC: 0 % (ref 0.0–0.2)

## 2022-04-13 LAB — TYPE AND SCREEN
ABO/RH(D): A NEG
Antibody Screen: NEGATIVE

## 2022-04-13 LAB — SURGICAL PCR SCREEN
MRSA, PCR: NEGATIVE
Staphylococcus aureus: NEGATIVE

## 2022-04-13 LAB — COMPREHENSIVE METABOLIC PANEL
ALT: 14 U/L (ref 0–44)
AST: 14 U/L — ABNORMAL LOW (ref 15–41)
Albumin: 4 g/dL (ref 3.5–5.0)
Alkaline Phosphatase: 65 U/L (ref 38–126)
Anion gap: 7 (ref 5–15)
BUN: 12 mg/dL (ref 8–23)
CO2: 25 mmol/L (ref 22–32)
Calcium: 9 mg/dL (ref 8.9–10.3)
Chloride: 106 mmol/L (ref 98–111)
Creatinine, Ser: 0.89 mg/dL (ref 0.61–1.24)
GFR, Estimated: >60 mL/min (ref >60)
Glucose, Bld: 87 mg/dL (ref 70–99)
Potassium: 4.2 mmol/L (ref 3.5–5.1)
Sodium: 138 mmol/L (ref 135–145)
Total Bilirubin: 0.4 mg/dL (ref 0.3–1.2)
Total Protein: 6.7 g/dL (ref 6.5–8.1)

## 2022-04-13 NOTE — Progress Notes (Signed)
Anesthesia note:   PCP - Dr. Roselle Locus Cardiologist -none Other-   Chest x-ray - no EKG - 03/17/22-chart Stress Test - no ECHO - no Cardiac Cath - no CABG-no Pacemaker/ICD device last checked:no  Sleep Study - no CPAP -   Pt is pre diabetic-no CBG at PAT visit- Fasting Blood Sugar at home- Checks Blood Sugar _____  Blood Thinner:no. Uses  goody powders and will stop 4/7 Blood Thinner Instructions: Aspirin Instructions: Last Dose:  Anesthesia review: /No  reason:  Patient denies shortness of breath, fever, cough and chest pain at PAT appointment. Pt has no SOB with activities. He is very anxious about this surgery.    Patient verbalized understanding of instructions that were given to them at the PAT appointment. Patient was also instructed that they will need to review over the PAT instructions again at home before surgery.yes

## 2022-04-24 NOTE — Anesthesia Preprocedure Evaluation (Signed)
Anesthesia Evaluation  Patient identified by MRN, date of birth, ID band Patient awake    Reviewed: Allergy & Precautions, H&P , NPO status , Patient's Chart, lab work & pertinent test results  Airway Mallampati: I  TM Distance: >3 FB Neck ROM: Full    Dental no notable dental hx. (+) Teeth Intact, Dental Advisory Given   Pulmonary Current Smoker and Patient abstained from smoking.   Pulmonary exam normal breath sounds clear to auscultation       Cardiovascular Exercise Tolerance: Good negative cardio ROS Normal cardiovascular exam Rhythm:Regular Rate:Normal     Neuro/Psych negative neurological ROS  negative psych ROS   GI/Hepatic negative GI ROS, Neg liver ROS,,,  Endo/Other  negative endocrine ROS    Renal/GU negative Renal ROS  negative genitourinary   Musculoskeletal negative musculoskeletal ROS (+) Arthritis , Osteoarthritis,    Abdominal   Peds negative pediatric ROS (+)  Hematology negative hematology ROS (+)   Anesthesia Other Findings   Reproductive/Obstetrics negative OB ROS                              Anesthesia Physical Anesthesia Plan  ASA: 2  Anesthesia Plan: MAC and Spinal   Post-op Pain Management: Tylenol PO (pre-op)* and Celebrex PO (pre-op)*   Induction: Intravenous  PONV Risk Score and Plan: 1 and Propofol infusion and Treatment may vary due to age or medical condition  Airway Management Planned: Natural Airway and Simple Face Mask  Additional Equipment: None  Intra-op Plan:   Post-operative Plan:   Informed Consent: I have reviewed the patients History and Physical, chart, labs and discussed the procedure including the risks, benefits and alternatives for the proposed anesthesia with the patient or authorized representative who has indicated his/her understanding and acceptance.       Plan Discussed with: Anesthesiologist and CRNA  Anesthesia  Plan Comments: (  )        Anesthesia Quick Evaluation

## 2022-04-25 ENCOUNTER — Encounter (HOSPITAL_COMMUNITY)
Admission: RE | Disposition: A | Discharge status: Home / Self Care | Payer: Self-pay | Source: Ambulatory Visit | Attending: Orthopedic Surgery

## 2022-04-25 ENCOUNTER — Encounter (HOSPITAL_COMMUNITY): Payer: Self-pay | Admitting: Orthopedic Surgery

## 2022-04-25 ENCOUNTER — Ambulatory Visit (HOSPITAL_COMMUNITY): Payer: Medicare Other

## 2022-04-25 ENCOUNTER — Ambulatory Visit (HOSPITAL_COMMUNITY): Payer: Medicare Other | Admitting: Anesthesiology

## 2022-04-25 ENCOUNTER — Other Ambulatory Visit: Payer: Self-pay

## 2022-04-25 ENCOUNTER — Ambulatory Visit (HOSPITAL_BASED_OUTPATIENT_CLINIC_OR_DEPARTMENT_OTHER): Payer: Medicare Other | Admitting: Anesthesiology

## 2022-04-25 ENCOUNTER — Ambulatory Visit (HOSPITAL_COMMUNITY)
Admission: RE | Admit: 2022-04-25 | Discharge: 2022-04-25 | Disposition: A | Discharge status: Home / Self Care | Payer: Medicare Other | Source: Ambulatory Visit | Attending: Orthopedic Surgery | Admitting: Orthopedic Surgery

## 2022-04-25 DIAGNOSIS — M1612 Unilateral primary osteoarthritis, left hip: Secondary | ICD-10-CM | POA: Diagnosis not present

## 2022-04-25 DIAGNOSIS — M1611 Unilateral primary osteoarthritis, right hip: Secondary | ICD-10-CM | POA: Insufficient documentation

## 2022-04-25 DIAGNOSIS — Z01818 Encounter for other preprocedural examination: Secondary | ICD-10-CM

## 2022-04-25 DIAGNOSIS — F17200 Nicotine dependence, unspecified, uncomplicated: Secondary | ICD-10-CM | POA: Insufficient documentation

## 2022-04-25 HISTORY — PX: TOTAL HIP ARTHROPLASTY: SHX124

## 2022-04-25 LAB — ABO/RH: ABO/RH(D): A NEG

## 2022-04-25 SURGERY — ARTHROPLASTY, HIP, TOTAL,POSTERIOR APPROACH
Anesthesia: Monitor Anesthesia Care | Site: Hip | Laterality: Right

## 2022-04-25 MED ORDER — TRANEXAMIC ACID-NACL 1000-0.7 MG/100ML-% IV SOLN
1000.0000 mg | INTRAVENOUS | Status: AC
  Administered 2022-04-25: 1000 mg via INTRAVENOUS
  Filled 2022-04-25: qty 100

## 2022-04-25 MED ORDER — ASPIRIN 81 MG PO TBEC: 81.0000 mg | DELAYED_RELEASE_TABLET | Freq: Two times a day (BID) | ORAL | 0 refills | Status: AC

## 2022-04-25 MED ORDER — SODIUM CHLORIDE (PF) 0.9 % IJ SOLN
INTRAMUSCULAR | Status: DC | PRN
  Administered 2022-04-25: 60 mL

## 2022-04-25 MED ORDER — FENTANYL CITRATE PF 50 MCG/ML IJ SOSY: 25.0000 ug | PREFILLED_SYRINGE | INTRAMUSCULAR | Status: DC | PRN

## 2022-04-25 MED ORDER — ACETAMINOPHEN 325 MG PO TABS: 325.0000 mg | ORAL_TABLET | ORAL | Status: DC | PRN

## 2022-04-25 MED ORDER — METHOCARBAMOL 500 MG IVPB - SIMPLE MED
INTRAVENOUS | Status: AC
  Filled 2022-04-25: qty 55

## 2022-04-25 MED ORDER — SODIUM CHLORIDE 0.9 % IV SOLN: INTRAVENOUS | Status: DC

## 2022-04-25 MED ORDER — SODIUM CHLORIDE (PF) 0.9 % IJ SOLN
INTRAMUSCULAR | Status: AC
  Filled 2022-04-25: qty 50

## 2022-04-25 MED ORDER — CELECOXIB 100 MG PO CAPS: 100.0000 mg | ORAL_CAPSULE | Freq: Two times a day (BID) | ORAL | 0 refills | Status: AC

## 2022-04-25 MED ORDER — OXYCODONE HCL 5 MG PO TABS: 5.0000 mg | ORAL_TABLET | ORAL | Status: DC | PRN

## 2022-04-25 MED ORDER — BUPIVACAINE LIPOSOME 1.3 % IJ SUSP
INTRAMUSCULAR | Status: DC | PRN
  Administered 2022-04-25: 20 mL

## 2022-04-25 MED ORDER — PHENYLEPHRINE HCL (PRESSORS) 10 MG/ML IV SOLN
INTRAVENOUS | Status: AC
  Filled 2022-04-25: qty 1

## 2022-04-25 MED ORDER — MIDAZOLAM HCL 2 MG/2ML IJ SOLN
INTRAMUSCULAR | Status: DC | PRN
  Administered 2022-04-25: 1 mg via INTRAVENOUS

## 2022-04-25 MED ORDER — LACTATED RINGERS IV BOLUS
250.0000 mL | Freq: Once | INTRAVENOUS | Status: AC
  Administered 2022-04-25: 250 mL via INTRAVENOUS

## 2022-04-25 MED ORDER — KETOROLAC TROMETHAMINE 15 MG/ML IJ SOLN
INTRAMUSCULAR | Status: AC
  Filled 2022-04-25: qty 1

## 2022-04-25 MED ORDER — HYDROMORPHONE HCL 1 MG/ML IJ SOLN: 0.5000 mg | INTRAMUSCULAR | Status: DC | PRN

## 2022-04-25 MED ORDER — ACETAMINOPHEN 500 MG PO TABS: 1000.0000 mg | ORAL_TABLET | Freq: Four times a day (QID) | ORAL | Status: DC

## 2022-04-25 MED ORDER — PHENYLEPHRINE HCL-NACL 20-0.9 MG/250ML-% IV SOLN
INTRAVENOUS | Status: DC | PRN
  Administered 2022-04-25: 40 ug/min via INTRAVENOUS

## 2022-04-25 MED ORDER — METHOCARBAMOL 500 MG PO TABS: 500.0000 mg | ORAL_TABLET | Freq: Four times a day (QID) | ORAL | Status: DC | PRN

## 2022-04-25 MED ORDER — OXYCODONE HCL 5 MG PO TABS
5.0000 mg | ORAL_TABLET | Freq: Once | ORAL | Status: AC | PRN
  Administered 2022-04-25: 5 mg via ORAL

## 2022-04-25 MED ORDER — OXYCODONE HCL 5 MG PO TABS: 5.0000 mg | ORAL_TABLET | ORAL | 0 refills | Status: AC | PRN

## 2022-04-25 MED ORDER — WATER FOR IRRIGATION, STERILE IR SOLN
Status: DC | PRN
  Administered 2022-04-25: 2000 mL

## 2022-04-25 MED ORDER — 0.9 % SODIUM CHLORIDE (POUR BTL) OPTIME
TOPICAL | Status: DC | PRN
  Administered 2022-04-25: 1000 mL

## 2022-04-25 MED ORDER — METHOCARBAMOL 500 MG PO TABS: 500.0000 mg | ORAL_TABLET | Freq: Three times a day (TID) | ORAL | 0 refills | Status: AC | PRN

## 2022-04-25 MED ORDER — KETOROLAC TROMETHAMINE 30 MG/ML IJ SOLN
INTRAMUSCULAR | Status: AC
  Filled 2022-04-25: qty 1

## 2022-04-25 MED ORDER — SODIUM CHLORIDE 0.9 % IR SOLN
Status: DC | PRN
  Administered 2022-04-25: 4000 mL

## 2022-04-25 MED ORDER — ACETAMINOPHEN 500 MG PO TABS: 1000.0000 mg | ORAL_TABLET | Freq: Three times a day (TID) | ORAL | 0 refills | Status: AC | PRN

## 2022-04-25 MED ORDER — ONDANSETRON HCL 4 MG/2ML IJ SOLN
INTRAMUSCULAR | Status: DC | PRN
  Administered 2022-04-25: 4 mg via INTRAVENOUS

## 2022-04-25 MED ORDER — ACETAMINOPHEN 160 MG/5ML PO SOLN: 325.0000 mg | ORAL | Status: DC | PRN

## 2022-04-25 MED ORDER — ISOPROPYL ALCOHOL 70 % SOLN
Status: DC | PRN
  Administered 2022-04-25: 1 via TOPICAL

## 2022-04-25 MED ORDER — ACETAMINOPHEN 500 MG PO TABS
1000.0000 mg | ORAL_TABLET | Freq: Once | ORAL | Status: AC
  Administered 2022-04-25: 1000 mg via ORAL
  Filled 2022-04-25: qty 2

## 2022-04-25 MED ORDER — ONDANSETRON HCL 4 MG/2ML IJ SOLN: 4.0000 mg | Freq: Four times a day (QID) | INTRAMUSCULAR | Status: DC | PRN

## 2022-04-25 MED ORDER — LACTATED RINGERS IV SOLN: INTRAVENOUS | Status: DC

## 2022-04-25 MED ORDER — PHENYLEPHRINE 80 MCG/ML (10ML) SYRINGE FOR IV PUSH (FOR BLOOD PRESSURE SUPPORT)
PREFILLED_SYRINGE | INTRAVENOUS | Status: DC | PRN
  Administered 2022-04-25: 80 ug via INTRAVENOUS
  Administered 2022-04-25: 160 ug via INTRAVENOUS

## 2022-04-25 MED ORDER — CEFAZOLIN SODIUM-DEXTROSE 2-4 GM/100ML-% IV SOLN: 2.0000 g | Freq: Four times a day (QID) | INTRAVENOUS | Status: DC

## 2022-04-25 MED ORDER — CHLORHEXIDINE GLUCONATE 0.12 % MT SOLN
15.0000 mL | Freq: Once | OROMUCOSAL | Status: AC
  Administered 2022-04-25: 15 mL via OROMUCOSAL

## 2022-04-25 MED ORDER — BUPIVACAINE IN DEXTROSE 0.75-8.25 % IT SOLN
INTRATHECAL | Status: DC | PRN
  Administered 2022-04-25: 2 mL via INTRATHECAL

## 2022-04-25 MED ORDER — ONDANSETRON HCL 4 MG PO TABS: 4.0000 mg | ORAL_TABLET | Freq: Four times a day (QID) | ORAL | Status: DC | PRN

## 2022-04-25 MED ORDER — DEXAMETHASONE SODIUM PHOSPHATE 10 MG/ML IJ SOLN: 8.0000 mg | Freq: Once | INTRAMUSCULAR | Status: DC

## 2022-04-25 MED ORDER — ORAL CARE MOUTH RINSE: 15.0000 mL | Freq: Once | OROMUCOSAL | Status: AC

## 2022-04-25 MED ORDER — ACETAMINOPHEN 325 MG PO TABS: 325.0000 mg | ORAL_TABLET | Freq: Four times a day (QID) | ORAL | Status: DC | PRN

## 2022-04-25 MED ORDER — MIDAZOLAM HCL 2 MG/2ML IJ SOLN: 1.0000 mg | INTRAMUSCULAR | Status: DC

## 2022-04-25 MED ORDER — HYDROMORPHONE HCL 1 MG/ML IJ SOLN
INTRAMUSCULAR | Status: AC
  Administered 2022-04-25: 0.5 mg
  Filled 2022-04-25: qty 1

## 2022-04-25 MED ORDER — ONDANSETRON HCL 4 MG PO TABS: 4.0000 mg | ORAL_TABLET | Freq: Three times a day (TID) | ORAL | 0 refills | Status: AC | PRN

## 2022-04-25 MED ORDER — CEFAZOLIN SODIUM-DEXTROSE 2-4 GM/100ML-% IV SOLN
2.0000 g | INTRAVENOUS | Status: AC
  Administered 2022-04-25: 2 g via INTRAVENOUS
  Filled 2022-04-25: qty 100

## 2022-04-25 MED ORDER — METHOCARBAMOL 500 MG IVPB - SIMPLE MED
500.0000 mg | Freq: Four times a day (QID) | INTRAVENOUS | Status: DC | PRN
  Administered 2022-04-25: 500 mg via INTRAVENOUS

## 2022-04-25 MED ORDER — SODIUM CHLORIDE (PF) 0.9 % IJ SOLN
INTRAMUSCULAR | Status: AC
  Filled 2022-04-25: qty 10

## 2022-04-25 MED ORDER — PROPOFOL 10 MG/ML IV BOLUS
INTRAVENOUS | Status: DC | PRN
  Administered 2022-04-25: 30 mg via INTRAVENOUS

## 2022-04-25 MED ORDER — MEPERIDINE HCL 50 MG/ML IJ SOLN: 6.2500 mg | INTRAMUSCULAR | Status: DC | PRN

## 2022-04-25 MED ORDER — MIDAZOLAM HCL 2 MG/2ML IJ SOLN
INTRAMUSCULAR | Status: AC
  Administered 2022-04-25: 1 mg via INTRAVENOUS
  Filled 2022-04-25: qty 2

## 2022-04-25 MED ORDER — OXYCODONE HCL 5 MG/5ML PO SOLN: 5.0000 mg | Freq: Once | ORAL | Status: AC | PRN

## 2022-04-25 MED ORDER — ONDANSETRON HCL 4 MG/2ML IJ SOLN: 4.0000 mg | Freq: Once | INTRAMUSCULAR | Status: DC | PRN

## 2022-04-25 MED ORDER — BUPIVACAINE LIPOSOME 1.3 % IJ SUSP: 20.0000 mL | Freq: Once | INTRAMUSCULAR | Status: DC

## 2022-04-25 MED ORDER — PROPOFOL 500 MG/50ML IV EMUL
INTRAVENOUS | Status: DC | PRN
  Administered 2022-04-25: 75 ug/kg/min via INTRAVENOUS

## 2022-04-25 MED ORDER — OXYCODONE HCL 5 MG PO TABS
ORAL_TABLET | ORAL | Status: AC
  Filled 2022-04-25: qty 1

## 2022-04-25 MED ORDER — ONDANSETRON HCL 4 MG/2ML IJ SOLN
INTRAMUSCULAR | Status: AC
  Filled 2022-04-25: qty 2

## 2022-04-25 MED ORDER — KETOROLAC TROMETHAMINE 15 MG/ML IJ SOLN
7.5000 mg | Freq: Four times a day (QID) | INTRAMUSCULAR | Status: DC
  Administered 2022-04-25: 7.5 mg via INTRAVENOUS

## 2022-04-25 MED ORDER — LACTATED RINGERS IV BOLUS
500.0000 mL | Freq: Once | INTRAVENOUS | Status: AC
  Administered 2022-04-25: 500 mL via INTRAVENOUS

## 2022-04-25 MED ORDER — DEXAMETHASONE SODIUM PHOSPHATE 10 MG/ML IJ SOLN
INTRAMUSCULAR | Status: DC | PRN
  Administered 2022-04-25: 10 mg via INTRAVENOUS

## 2022-04-25 MED ORDER — POVIDONE-IODINE 10 % EX SWAB: 2.0000 | Freq: Once | CUTANEOUS | Status: DC

## 2022-04-25 MED ORDER — HEMOSTATIC AGENTS (NO CHARGE) OPTIME
TOPICAL | Status: DC | PRN
  Administered 2022-04-25: 1 via TOPICAL

## 2022-04-25 MED ORDER — MIDAZOLAM HCL 2 MG/2ML IJ SOLN
INTRAMUSCULAR | Status: AC
  Filled 2022-04-25: qty 2

## 2022-04-25 MED ORDER — DEXAMETHASONE SODIUM PHOSPHATE 10 MG/ML IJ SOLN
INTRAMUSCULAR | Status: AC
  Filled 2022-04-25: qty 1

## 2022-04-25 SURGICAL SUPPLY — 71 items
ADH SKN CLS APL DERMABOND .7 (GAUZE/BANDAGES/DRESSINGS) ×1
APL PRP STRL LF DISP 70% ISPRP (MISCELLANEOUS) ×2
BAG COUNTER SPONGE SURGICOUNT (BAG) IMPLANT
BAG DECANTER FOR FLEXI CONT (MISCELLANEOUS) ×1 IMPLANT
BAG SPEC THK2 15X12 ZIP CLS (MISCELLANEOUS) ×1
BAG SPNG CNTER NS LX DISP (BAG)
BAG ZIPLOCK 12X15 (MISCELLANEOUS) ×1 IMPLANT
BLADE SAW SAG 25X90X1.19 (BLADE) ×1 IMPLANT
CHLORAPREP W/TINT 26 (MISCELLANEOUS) ×2 IMPLANT
COVER SURGICAL LIGHT HANDLE (MISCELLANEOUS) ×1 IMPLANT
DERMABOND ADVANCED .7 DNX12 (GAUZE/BANDAGES/DRESSINGS) ×1 IMPLANT
DRAPE HIP W/POCKET STRL (MISCELLANEOUS) ×1 IMPLANT
DRAPE INCISE IOBAN 66X45 STRL (DRAPES) ×1 IMPLANT
DRAPE INCISE IOBAN 85X60 (DRAPES) ×1 IMPLANT
DRAPE POUCH INSTRU U-SHP 10X18 (DRAPES) ×1 IMPLANT
DRAPE SHEET LG 3/4 BI-LAMINATE (DRAPES) ×3 IMPLANT
DRAPE SURG 17X11 SM STRL (DRAPES) ×1 IMPLANT
DRAPE U-SHAPE 47X51 STRL (DRAPES) ×2 IMPLANT
DRESSING AQUACEL AG SP 3.5X10 (GAUZE/BANDAGES/DRESSINGS) ×1 IMPLANT
DRSG AQUACEL AG SP 3.5X10 (GAUZE/BANDAGES/DRESSINGS) ×1
ELECT BLADE TIP CTD 4 INCH (ELECTRODE) ×1 IMPLANT
ELECT REM PT RETURN 15FT ADLT (MISCELLANEOUS) ×1 IMPLANT
GLOVE BIO SURGEON STRL SZ 6.5 (GLOVE) ×2 IMPLANT
GLOVE BIOGEL PI IND STRL 6.5 (GLOVE) ×1 IMPLANT
GLOVE BIOGEL PI IND STRL 8 (GLOVE) ×1 IMPLANT
GLOVE SURG ORTHO 8.0 STRL STRW (GLOVE) ×2 IMPLANT
GOWN STRL REUS W/ TWL XL LVL3 (GOWN DISPOSABLE) ×2 IMPLANT
GOWN STRL REUS W/TWL XL LVL3 (GOWN DISPOSABLE) ×2
HANDPIECE INTERPULSE COAX TIP (DISPOSABLE)
HEAD CERAMIC FEMORAL 36MM (Head) IMPLANT
HEMOSTAT ARISTA ABSORB 3G PWDR (HEMOSTASIS) IMPLANT
HOLDER FOLEY CATH W/STRAP (MISCELLANEOUS) ×1 IMPLANT
HOOD PEEL AWAY T7 (MISCELLANEOUS) ×3 IMPLANT
INSERT TRIDENT POLY 36 0DEG (Insert) IMPLANT
JET LAVAGE IRRISEPT WOUND (IRRIGATION / IRRIGATOR)
KIT BASIN OR (CUSTOM PROCEDURE TRAY) ×1 IMPLANT
KIT TURNOVER KIT A (KITS) IMPLANT
LAVAGE JET IRRISEPT WOUND (IRRIGATION / IRRIGATOR) IMPLANT
MANIFOLD NEPTUNE II (INSTRUMENTS) ×1 IMPLANT
MARKER SKIN DUAL TIP RULER LAB (MISCELLANEOUS) ×1 IMPLANT
NDL HYPO 22X1.5 SAFETY MO (MISCELLANEOUS) IMPLANT
NEEDLE HYPO 22X1.5 SAFETY MO (MISCELLANEOUS) IMPLANT
NS IRRIG 1000ML POUR BTL (IV SOLUTION) ×1 IMPLANT
PACK TOTAL JOINT (CUSTOM PROCEDURE TRAY) ×1 IMPLANT
PRESSURIZER FEMORAL UNIV (MISCELLANEOUS) IMPLANT
PROTECTOR NERVE ULNAR (MISCELLANEOUS) ×1 IMPLANT
RETRIEVER SUT HEWSON (MISCELLANEOUS) ×1 IMPLANT
SCREW HEX LP 6.5X20 (Screw) IMPLANT
SCREW HEX LP 6.5X25 (Screw) IMPLANT
SEALER BIPOLAR AQUA 6.0 (INSTRUMENTS) ×1 IMPLANT
SET HNDPC FAN SPRY TIP SCT (DISPOSABLE) IMPLANT
SHELL ACETABUL CLUSTER SZ 54 (Shell) IMPLANT
SPIKE FLUID TRANSFER (MISCELLANEOUS) ×3 IMPLANT
STEM ACCOLADE II SZ8 (Stem) IMPLANT
SUCTION FRAZIER HANDLE 12FR (TUBING) ×1
SUCTION TUBE FRAZIER 12FR DISP (TUBING) ×1 IMPLANT
SUT BONE WAX W31G (SUTURE) ×1 IMPLANT
SUT ETHIBOND #5 BRAIDED 30INL (SUTURE) ×1 IMPLANT
SUT MNCRL AB 3-0 PS2 18 (SUTURE) ×1 IMPLANT
SUT STRATAFIX 0 PDS 27 VIOLET (SUTURE) ×1
SUT STRATAFIX PDO 1 14 VIOLET (SUTURE) ×1
SUT STRATFX PDO 1 14 VIOLET (SUTURE) ×1
SUT VIC AB 2-0 CT2 27 (SUTURE) ×2 IMPLANT
SUTURE STRATFX 0 PDS 27 VIOLET (SUTURE) ×1 IMPLANT
SUTURE STRATFX PDO 1 14 VIOLET (SUTURE) ×1 IMPLANT
SYR 20ML LL LF (SYRINGE) ×2 IMPLANT
TOWEL OR 17X26 10 PK STRL BLUE (TOWEL DISPOSABLE) ×1 IMPLANT
TRAY FOLEY MTR SLVR 16FR STAT (SET/KITS/TRAYS/PACK) ×1 IMPLANT
TUBE SUCTION HIGH CAP CLEAR NV (SUCTIONS) ×1 IMPLANT
UNDERPAD 30X36 HEAVY ABSORB (UNDERPADS AND DIAPERS) ×1 IMPLANT
WATER STERILE IRR 1000ML POUR (IV SOLUTION) ×2 IMPLANT

## 2022-04-25 NOTE — Op Note (Addendum)
04/25/2022  9:08 AM  PATIENT:  Lucas Butler   MRN: [id number]  PRE-OPERATIVE DIAGNOSIS:  End-stage right hip osteoarthritis  POST-OPERATIVE DIAGNOSIS:  same  PROCEDURE:  Procedure(s): TOTAL HIP ARTHROPLASTY  PREOPERATIVE INDICATIONS:    Lucas Butler is an 67 y.o. male who has a diagnosis of end-stage right hip osteoarthritis and elected for surgical management after failing conservative treatment.  The risks benefits and alternatives were discussed with the patient including but not limited to the risks of nonoperative treatment, versus surgical intervention including infection, bleeding, nerve injury, periprosthetic fracture, the need for revision surgery, dislocation, leg length discrepancy, blood clots, cardiopulmonary complications, morbidity, mortality, among others, and they were willing to proceed.     OPERATIVE REPORT     SURGEON:  Weber Cooks, MD    ASSISTANT: Kathie Dike, PA-C, (Present throughout the entire procedure,  necessary for completion of procedure in a timely manner, assisting with retraction, instrumentation, and closure)     ANESTHESIA: Spinal  ESTIMATED BLOOD LOSS: 250cc    COMPLICATIONS:  None.      COMPONENTS:   Trident 2 TriTanium 54 mm cluster hole acetabular shell, 6.5 hex screws x 2, neutral Trident 3 X.3 polyethylene liner, Accolade 2 with 127 degree neck angle size #8, 36+0 ceramic head Implant Name Type Inv. Item Serial No. Manufacturer Lot No. LRB No. Used Action  SHELL ACETABUL CLUSTER SZ 54 - [id number] Shell SHELL ACETABUL CLUSTER SZ 54  [employer] [id number] A Right 1 Implanted  SCREW HEX LP 6.5X25 - [id number] Screw SCREW HEX LP 6.5X25  [employer] [id number] Right 1 Implanted  SCREW HEX LP 6.5X20 - [id number] Screw SCREW HEX LP 6.5X20  [employer] [id number] Right 1 Implanted  INSERT TRIDENT POLY 36 0DEG - [id number] Insert INSERT TRIDENT POLY 36 0DEG  [employer] [id number] Right 1 Implanted  STEM ACCOLADE II SZ8  - [id number] Stem STEM ACCOLADE II SZ8  [employer] [id number] A Right 1 Implanted  HEAD CERAMIC FEMORAL - [id number] Head HEAD CERAMIC FEMORAL  [employer] [id number] Right 1 Implanted    The aquamantis was utilized for this case to help facilitate better hemostasis as patient was felt to be at increased risk of bleeding because of complex case requiring increased OR time and/or exposure.  Severe arthritic deformity.  Significant osteophytosis        PROCEDURE IN DETAIL:   The patient was met in the holding area and  identified.  The appropriate hip was identified and marked at the operative site.  The patient was then transported to the OR  and  placed under anesthesia.  At that point, the patient was  placed in the lateral decubitus position with the operative side up and  secured to the operating room table  and all bony prominences padded. A subaxillary role was also placed.    The operative lower extremity was prepped from the iliac crest to the distal leg.  Sterile draping was performed.  Preoperative antibiotics, 2 gm of ancef,1 gm of Tranexamic Acid, and 8 mg of Decadron administered. Time out was performed prior to incision.      A routine posterolateral approach was utilized via sharp dissection  carried down to the subcutaneous tissue.  Gross bleeders were Bovie coagulated.  The iliotibial band was identified and incised along the length of the skin incision through the glute max fascia.  Charnley retractor was placed with care to protect the sciatic nerve posteriorly.  With the hip internally rotated, the piriformis tendon  was identified and released from the femoral insertion and tagged with a #5 Ethibond.  A capsulotomy was then performed off the femoral insertion and also tagged with a #5 Ethibond.    The femoral neck was exposed, and I resected the femoral neck based on preoperative templating relative to the lesser trochanter.    I then exposed the deep  acetabulum, cleared out any tissue including the ligamentum teres.  After adequate visualization, I excised the labrum.  I then started reaming with a 52 mm reamer, first medializing to the floor of the cotyloid fossa, and then in the position of the cup aiming towards the greater sciatic notch, matching the version of the transverse acetabular ligament and tucked under the anterior wall. I reamed up to 54 mm reamer with good bony bed preparation and a 54 mm cup was chosen.  The real cup was then impacted into place.  Appropriate version and inclination was confirmed clinically matching their bony anatomy, and also with the use of the jig.  I placed 2 screws in the posterior superior quadrant to augment fixation.  A neutral liner was placed and impacted. There was severe anterior and posterior osteophytes which were careful removed with a 1/2" osteotome  to prevent impingement. The liner was confirmed to be appropriately seated and the acetabular retractors were removed.    I then prepared the proximal femur using the box cutter, Charnley awl, and then sequentially broached starting with 0 up to a size 6.  A trial broach, neck, and head was utilized, and I reduced the hip and it was found to have excellent stability.  There was no impingement with full extension and 90 degrees external rotation.  The hip was stable at the position of sleep and with 90 degrees flexion and 70degrees of internal rotation.  Leg lengths were also clinically assessed in the lateral position and felt to be equal. Intra-Op flatplate was obtained and confirmed appropriate component positions.  No evidence or concern for fracture.  Felt that the size 6 stem was still a little bit small.  Felt that we had come close to restoring leg lengths but given known severe femoral head collapse felt we could work up to a larger size to increase his length and offset some.  Was able to work up to the size 8 broach which had good axial and  rotational stability.  Also had improved stability during trialing.  A final femoral prosthesis size 8 was selected. I then impacted the real femoral prosthesis into place.I again trialed and selected a 36+ 27mm ball. The hip was then reduced and taken through a range of motion. There was no impingement with full extension and 90 degrees external rotation.  The hip was stable at the position of sleep and with 90 degrees flexion and 80 degrees of internal rotation. Leg lengths were  again assessed and felt to be restored.  We then opened, and I impacted the real head ball into place.  The posterior capsule was then closed with #5 Ethibond.  The piriformis was repaired through the base of the abductor tendon using a [place] suture passer.  I then irrigated the hip copiously with dilute Betadine and with normal saline pulse lavage. Given the significant amount of osteophytes removed, arista hemostatic agent 1 unit was placed into the wound to help reduce post op bleeding. Periarticular injection was then performed with Exparel.   We repaired the fascia #1 barbed suture, followed by 0 barbed suture for the  subcutaneous fat.  Skin was closed with 2-0 Vicryl and 3-0 Monocryl.  Dermabond and Aquacel dressing were applied. The patient was then awakened and returned to PACU in stable and satisfactory condition.  Leg lengths in the supine position were assessed and felt to be clinically equal. There were no complications.  Post op recs: WB: WBAT RLE, No formal hip precautions Abx: ancef Imaging: PACU pelvis Xray Dressing: Aquacell, keep intact until follow up DVT prophylaxis: Aspirin 81BID starting POD1 Follow up: 2 weeks after surgery for a wound check with Dr. Blanchie Dessert at [facility].  Address: [address], [place], [place] [address]  Office Phone: [phone]   Weber Cooks, MD Orthopedic Surgeon

## 2022-04-25 NOTE — Anesthesia Procedure Notes (Signed)
Spinal  Patient location during procedure: OR Start time: 04/25/2022 7:21 AM End time: 04/25/2022 7:23 AM Reason for block: surgical anesthesia Staffing Performed: resident/CRNA  Resident/CRNA: [place], Tyjae Shvartsman C, CRNA Performed by: [place], Ander Wamser C, CRNA Authorized by: Bethena Midget, MD   Preanesthetic Checklist Completed: patient identified, IV checked, site marked, risks and benefits discussed, surgical consent, monitors and equipment checked, pre-op evaluation and timeout performed Spinal Block Patient position: sitting Prep: DuraPrep and site prepped and draped Patient monitoring: heart rate, cardiac monitor, continuous pulse ox and blood pressure Approach: midline Location: L3-4 Injection technique: single-shot Needle Needle type: Pencan  Needle gauge: 24 G Needle length: 9 cm Assessment Sensory level: T4 Events: CSF return Additional Notes IV functioning, monitors applied to pt. Expiration date of kit checked and confirmed to be in date. Sterile prep and drape, hand hygiene and sterile gloved used. Pt was positioned and spine was prepped in sterile fashion. Skin was anesthetized with lidocaine. Free flow of clear CSF obtained prior to injecting local anesthetic into CSF x 1 attempt. Spinal needle aspirated freely following injection. Needle was carefully withdrawn, and pt tolerated procedure well. Loss of motor and sensory on exam post injection.

## 2022-04-25 NOTE — Evaluation (Signed)
Physical Therapy Evaluation Patient Details Name: Lucas Butler MRN: [id number] DOB: 11-02-1955 Today's Date: 04/25/2022  History of Present Illness  67 yo male presents to therapy s/p R THA on 04/25/2022 due to failure of conservative measures. Pt PMH includes but is not limited to: tobacco abuse and hx of kidney stones.  Clinical Impression    [name] is a 67 y.o. male POD 0 s/p L THA, posterior lateral approach. Patient reports mod I at [facility]  with mobility at baseline. Patient is now limited by functional impairments (see PT problem list below) and requires min guard and cues for transfers and gait with RW. Patient was able to ambulate 50 x2 feet with RW and min guard and cues for safe walker management and initiated on more normalized gait pattern. Patient educated on safe sequencing for stair mobility, car tranfers with running board on SUV, bed transfers, pain management including CP/Ice pt and wife verbalized understanding of safe guarding position for people assisting with mobility. Patient instructed in exercises to facilitate ROM and circulation reviewed and HO provided. Patient will benefit from continued skilled PT interventions to address impairments and progress towards PLOF. Patient has met mobility goals at adequate level for discharge home with family support and pt states OPPT scheduled 4/19; will continue to follow if pt continues acute stay to progress towards Mod I goals.      Recommendations for follow up therapy are one component of a multi-disciplinary discharge planning process, led by the attending physician.  Recommendations may be updated based on patient status, additional functional criteria and insurance authorization.  Follow Up Recommendations       Assistance Recommended at Discharge Intermittent Supervision/Assistance  Patient can return home with the following  A little help with walking and/or transfers;A little help with  bathing/dressing/bathroom;Assistance with cooking/housework;Assist for transportation;Help with stairs or ramp for entrance    Equipment Recommendations Rolling walker (2 wheels) (provided and adjusted at eval)  Recommendations for Other Services       Functional Status Assessment Patient has had a recent decline in their functional status and demonstrates the ability to make significant improvements in function in a reasonable and predictable amount of time.     Precautions / Restrictions Precautions Precautions: Fall Restrictions Weight Bearing Restrictions: No      Mobility  Bed Mobility Overal bed mobility: Needs Assistance Bed Mobility: Supine to Sit     Supine to sit: Min guard     General bed mobility comments: cues for technique and use of L LE to assist to scoot R, pt and family ed provided on getting into elevated bed in home setting with 2 steps and use of gait belt as leg lifter if needed    Transfers Overall transfer level: Needs assistance Equipment used: Rolling walker (2 wheels) Transfers: Sit to/from Stand Sit to Stand: Min guard           General transfer comment: cues for proper UE and R LE placement. pt has been compensating for pain report and is placing R LE anteriorly with signifcant R toe out    Ambulation/Gait Ambulation/Gait assistance: Min guard Gait Distance (Feet): 50 Feet Assistive device: Rolling walker (2 wheels) Gait Pattern/deviations: Step-to pattern, Decreased step length - left (excessive L PF with pt compensatory stratagies PLOF) Gait velocity: decreased     General Gait Details: cues for sequencing and safety wtih anterior and retograde stepping patterns  Stairs Stairs: Yes   Stair Management: One rail Right, With cane  Number of Stairs: 2 General stair comments: pt reports 2 steps to enter from porch have a piller/post for stability on the R side, PT provided step navigation with R UE support and cane in L hand, steps  initally trialed with B handrails with cues for sequencing and safety, PT verbally and visually demonstrated step navigation with RW for one step on the sidewalk with pt and wife demonstrating verbal understanding (PT instruction to step up on running board of SUV with use of RW and retrograde step with L LE first then R LE and placing buttock on car seat then transitioning LEs in SUV)  Wheelchair Mobility    Modified Rankin (Stroke Patients Only)       Balance Overall balance assessment: Needs assistance Sitting-balance support: Single extremity supported, Feet unsupported Sitting balance-Leahy Scale: Good     Standing balance support: During functional activity, Reliant on assistive device for balance, Bilateral upper extremity supported Standing balance-Leahy Scale: Poor                               Pertinent Vitals/Pain Pain Assessment Pain Assessment: 0-10 Pain Score: 2  Pain Location: R hip Pain Descriptors / Indicators: Burning, Operative site guarding Pain Intervention(s): Limited activity within patient's tolerance, Monitored during session, Repositioned, Premedicated before session, Ice applied    Home Living Family/patient expects to be discharged to:: Private residence Living Arrangements: Spouse/significant other Available Help at Discharge: Family Type of Home: House Home Access: Stairs to enter Entrance Stairs-Rails: None (reports using a post on the 2 steps to enter home from the porch) [employer] of Steps: 3   Home Layout: One level Home Equipment: Rollator (4 wheels);Cane - single point      Prior Function Prior Level of Function : Independent/Modified Independent             Mobility Comments: mod I with SPC for all ADLs, self care tasks, IADLs and driving       Hand Dominance        Extremity/Trunk Assessment        Lower Extremity Assessment Lower Extremity Assessment: RLE deficits/detail RLE Deficits /  Details: ankle DF/PF 5/5 RLE Sensation: WNL    Cervical / Trunk Assessment Cervical / Trunk Assessment: Normal  Communication   Communication: No difficulties  Cognition Arousal/Alertness: Awake/alert Behavior During Therapy: WFL for tasks assessed/performed Overall Cognitive Status: Within Functional Limits for tasks assessed                                          General Comments      Exercises Total Joint Exercises Ankle Circles/Pumps: AROM, Both, 20 reps Quad Sets: AROM, Right, 5 reps Heel Slides: AROM, Right, 5 reps Hip ABduction/ADduction: AROM, Right, 5 reps, Supine, Standing Long Arc Quad: AROM, Right, 5 reps Knee Flexion: AROM, Right, 5 reps Marching in Standing: AROM, Right, 5 reps Standing Hip Extension: AROM, Right, 5 reps   Assessment/Plan    PT Assessment Patient needs continued PT services  PT Problem List Decreased strength;Decreased range of motion;Decreased activity tolerance;Decreased balance;Decreased mobility;Decreased coordination;Decreased knowledge of use of DME;Pain       PT Treatment Interventions DME instruction;Gait training;Stair training;Functional mobility training;Therapeutic activities;Balance training;Therapeutic exercise;Neuromuscular re-education;Patient/family education;Modalities    PT Goals (Current goals can be found in the Care Plan section)  Acute Rehab PT  Goals Patient Stated Goal: to be able to keep up with the grandchildren and working around the house PT Goal Formulation: With patient Time For Goal Achievement: 05/09/22 Potential to Achieve Goals: Good    Frequency       Co-evaluation               AM-PAC PT "6 Clicks" Mobility  Outcome Measure Help needed turning from your back to your side while in a flat bed without using bedrails?: None Help needed moving from lying on your back to sitting on the side of a flat bed without using bedrails?: A Little Help needed moving to and from a bed to a  chair (including a wheelchair)?: A Little Help needed standing up from a chair using your arms (e.g., wheelchair or bedside chair)?: A Little Help needed to walk in hospital room?: A Little Help needed climbing 3-5 steps with a railing? : A Little 6 Click Score: 19    End of Session Equipment Utilized During Treatment: Gait belt Activity Tolerance: Patient tolerated treatment well Patient left: in chair;with call bell/phone within reach;with family/visitor present Nurse Communication: Mobility status;Other (comment) (progression toward d/c) PT Visit Diagnosis: Unsteadiness on feet (R26.81);Other abnormalities of gait and mobility (R26.89);Muscle weakness (generalized) (M62.81);Pain;Difficulty in walking, not elsewhere classified (R26.2) Pain - Right/Left: Right Pain - part of body: Hip    Time: 1211-1307 PT Time Calculation (min) (ACUTE ONLY): 56 min   Charges:   PT Evaluation $PT Eval Low Complexity: 1 Low PT Treatments $Gait Training: 8-22 mins $Therapeutic Exercise: 8-22 mins $Therapeutic Activity: 8-22 mins        Rica Mote, PT   Jacqualyn Posey 04/25/2022, 1:21 PM

## 2022-04-25 NOTE — Interval H&P Note (Signed)
The patient has been re-examined, and the chart reviewed, and there have been no interval changes to the documented history and physical.    Plan for R THA for R hip OA  The operative side was examined and the patient was confirmed to have sensation to DPN, SPN, TN intact, Motor EHL, ext, flex 5/5, and DP 2+, PT 2+, No significant edema.   The risks, benefits, and alternatives have been discussed at length with patient, and the patient is willing to proceed.  Right hip marked. Consent has been signed.  

## 2022-04-25 NOTE — Discharge Instructions (Signed)

## 2022-04-25 NOTE — Transfer of Care (Signed)
Immediate Anesthesia Transfer of Care Note  Patient: Waynetta Pean  Procedure(s) Performed: TOTAL HIP ARTHROPLASTY (Right: Hip)  Patient Location: PACU  Anesthesia Type:Spinal  Level of Consciousness: awake and drowsy  Airway & Oxygen Therapy: Patient Spontanous Breathing and Patient connected to face mask oxygen  Post-op Assessment: Report given to RN and Post -op Vital signs reviewed and stable  Post vital signs: Reviewed and stable  Last Vitals:  Vitals Value Taken Time  BP 111/69 04/25/22 0949  Temp 36.5 C 04/25/22 0949  Pulse 80 04/25/22 0956  Resp 18 04/25/22 0956  SpO2 100 % 04/25/22 0956  Vitals shown include unvalidated device data.  Last Pain:  Vitals:   04/25/22 0949  TempSrc:   PainSc: 0-No pain         Complications: No notable events documented.

## 2022-04-25 NOTE — Progress Notes (Signed)
Other 1mg  of versed given by Judeth Cornfield, CRNA upon departure to OR.

## 2022-04-27 ENCOUNTER — Encounter (HOSPITAL_COMMUNITY): Payer: Self-pay | Admitting: Orthopedic Surgery

## 2022-05-01 ENCOUNTER — Encounter (HOSPITAL_COMMUNITY): Payer: Self-pay | Admitting: Orthopedic Surgery

## 2022-05-01 NOTE — Anesthesia Postprocedure Evaluation (Signed)
Anesthesia Post Note  Patient: Lucas Butler  Procedure(s) Performed: TOTAL HIP ARTHROPLASTY (Right: Hip)     Patient location during evaluation: PACU Anesthesia Type: MAC and Spinal Level of consciousness: oriented and awake and alert Pain management: pain level controlled Vital Signs Assessment: post-procedure vital signs reviewed and stable Respiratory status: spontaneous breathing, respiratory function stable and patient connected to nasal cannula oxygen Cardiovascular status: blood pressure returned to baseline and stable Postop Assessment: no headache, no backache and no apparent nausea or vomiting Anesthetic complications: no   No notable events documented.  Last Vitals:  Vitals:   04/25/22 1044 04/25/22 1145  BP: 137/71 (!) 140/70  Pulse: 77 85  Resp: 15   Temp: 36.6 C   SpO2: 99% 98%    Last Pain:  Vitals:   04/25/22 1145  TempSrc:   PainSc: 3                  Neala Miggins

## 2022-05-12 DIAGNOSIS — R269 Unspecified abnormalities of gait and mobility: Secondary | ICD-10-CM | POA: Diagnosis not present

## 2022-05-12 DIAGNOSIS — Z96641 Presence of right artificial hip joint: Secondary | ICD-10-CM | POA: Diagnosis not present

## 2022-05-12 DIAGNOSIS — M6281 Muscle weakness (generalized): Secondary | ICD-10-CM | POA: Diagnosis not present

## 2022-05-12 DIAGNOSIS — M1611 Unilateral primary osteoarthritis, right hip: Secondary | ICD-10-CM | POA: Diagnosis not present

## 2022-05-18 DIAGNOSIS — Z96641 Presence of right artificial hip joint: Secondary | ICD-10-CM | POA: Diagnosis not present

## 2022-05-18 DIAGNOSIS — M6281 Muscle weakness (generalized): Secondary | ICD-10-CM | POA: Diagnosis not present

## 2022-05-18 DIAGNOSIS — M1611 Unilateral primary osteoarthritis, right hip: Secondary | ICD-10-CM | POA: Diagnosis not present

## 2022-05-18 DIAGNOSIS — R269 Unspecified abnormalities of gait and mobility: Secondary | ICD-10-CM | POA: Diagnosis not present

## 2022-05-23 DIAGNOSIS — M1611 Unilateral primary osteoarthritis, right hip: Secondary | ICD-10-CM | POA: Diagnosis not present

## 2022-05-23 DIAGNOSIS — R269 Unspecified abnormalities of gait and mobility: Secondary | ICD-10-CM | POA: Diagnosis not present

## 2022-05-23 DIAGNOSIS — M6281 Muscle weakness (generalized): Secondary | ICD-10-CM | POA: Diagnosis not present

## 2022-05-23 DIAGNOSIS — Z96641 Presence of right artificial hip joint: Secondary | ICD-10-CM | POA: Diagnosis not present

## 2022-05-30 DIAGNOSIS — M6281 Muscle weakness (generalized): Secondary | ICD-10-CM | POA: Diagnosis not present

## 2022-05-30 DIAGNOSIS — M1611 Unilateral primary osteoarthritis, right hip: Secondary | ICD-10-CM | POA: Diagnosis not present

## 2022-05-30 DIAGNOSIS — Z96641 Presence of right artificial hip joint: Secondary | ICD-10-CM | POA: Diagnosis not present

## 2022-05-30 DIAGNOSIS — R269 Unspecified abnormalities of gait and mobility: Secondary | ICD-10-CM | POA: Diagnosis not present

## 2022-06-07 DIAGNOSIS — M1611 Unilateral primary osteoarthritis, right hip: Secondary | ICD-10-CM | POA: Diagnosis not present

## 2022-06-09 DIAGNOSIS — M6281 Muscle weakness (generalized): Secondary | ICD-10-CM | POA: Diagnosis not present

## 2022-06-09 DIAGNOSIS — R269 Unspecified abnormalities of gait and mobility: Secondary | ICD-10-CM | POA: Diagnosis not present

## 2022-06-09 DIAGNOSIS — M1611 Unilateral primary osteoarthritis, right hip: Secondary | ICD-10-CM | POA: Diagnosis not present

## 2022-06-09 DIAGNOSIS — Z96641 Presence of right artificial hip joint: Secondary | ICD-10-CM | POA: Diagnosis not present

## 2022-07-08 DIAGNOSIS — C4442 Squamous cell carcinoma of skin of scalp and neck: Secondary | ICD-10-CM | POA: Diagnosis not present

## 2022-08-02 DIAGNOSIS — M25552 Pain in left hip: Secondary | ICD-10-CM | POA: Diagnosis not present

## 2022-08-02 NOTE — Progress Notes (Signed)
Sent message, via epic in basket, requesting orders in epic from surgeon.  

## 2022-08-03 ENCOUNTER — Ambulatory Visit: Payer: Self-pay | Admitting: Emergency Medicine

## 2022-08-03 DIAGNOSIS — G8929 Other chronic pain: Secondary | ICD-10-CM

## 2022-08-03 NOTE — H&P (View-Only) (Signed)
 TOTAL HIP ADMISSION H&P  Patient is admitted for left total hip arthroplasty.  Subjective:  Chief Complaint: left hip pain  HPI: Lucas Butler, 67 y.o. male, has a history of pain and functional disability in the left hip(s) due to arthritis and patient has failed non-surgical conservative treatments for greater than 12 weeks to include NSAID's and/or analgesics, use of assistive devices, and activity modification.  Onset of symptoms was gradual starting 10 years ago with gradually worsening course since that time.The patient noted no past surgery on the left hip(s).  Patient currently rates pain in the left hip at 8 out of 10 with activity. Patient has night pain, worsening of pain with activity and weight bearing, pain that interfers with activities of daily living, and pain with passive range of motion. Patient has evidence of periarticular osteophytes and joint space narrowing by imaging studies. This condition presents safety issues increasing the risk of falls.  There is no current active infection.  There are no problems to display for this patient.  Past Medical History:  Diagnosis Date   Arthritis    hands   Fatigue    History of kidney stones 2006    Past Surgical History:  Procedure Laterality Date   CATARACT EXTRACTION Right 2021   FINGER SURGERY Right 1981   pinky   FOOT SURGERY Bilateral 1979   [facility]ions   TOTAL HIP ARTHROPLASTY Right 04/25/2022   Procedure: TOTAL HIP ARTHROPLASTY;  Surgeon: Joen Laura, MD;  Location: [facility];  Service: Orthopedics;  Laterality: Right;    Current Outpatient Medications  Medication Sig Dispense Refill Last Dose   Multiple Vitamin (MULTIVITAMIN WITH MINERALS) TABS tablet Take 1 tablet by mouth daily. Centrum Silver      No current facility-administered medications for this visit.   No Known Allergies  Social History   Tobacco Use   Smoking status: Every Day    Current packs/day: 0.25    Average packs/day: 0.3 packs/day for  40.0 years (10.0 ttl pk-yrs)    Types: Cigarettes   Smokeless tobacco: Not on file  Substance Use Topics   Alcohol use: Yes    Comment: 7    No family history on file.   Review of Systems  Musculoskeletal:  Positive for arthralgias.  All other systems reviewed and are negative.   Objective:  Physical Exam Constitutional:      General: He is not in acute distress.    Appearance: Normal appearance. He is normal weight.  HENT:     Head: Normocephalic and atraumatic.  Eyes:     Extraocular Movements: Extraocular movements intact.     Conjunctiva/sclera: Conjunctivae normal.     Pupils: Pupils are equal, round, and reactive to light.  Cardiovascular:     Rate and Rhythm: Normal rate and regular rhythm.     Pulses: Normal pulses.     Heart sounds: Normal heart sounds.  Pulmonary:     Effort: Pulmonary effort is normal. No respiratory distress.     Breath sounds: Normal breath sounds.  Abdominal:     General: Bowel sounds are normal. There is no distension.     Palpations: Abdomen is soft.     Tenderness: There is no abdominal tenderness.  Musculoskeletal:        General: Tenderness present.     Cervical back: Normal range of motion and neck supple.     Comments: TTP over groin, lateral aspect, greater trochanter.  Mild IT band tenderness.  No significant swelling.  No overlying lesions of area of chief complaint.  Decreased strength and ROM due to elicited pain.  Dorsiflexion and plantarflexion intact.  BLE appear grossly neurovascularly intact.  Gait moderately antalgic.   Lymphadenopathy:     Cervical: No cervical adenopathy.  Skin:    General: Skin is warm and dry.     Capillary Refill: Capillary refill takes less than 2 seconds.     Findings: No erythema or rash.  Neurological:     General: No focal deficit present.     Mental Status: He is alert and oriented to person, place, and time.  Psychiatric:        Mood and Affect: Mood normal.        Behavior: Behavior  normal.     Vital signs in last 24 hours: @VSRANGES @  Labs:   Estimated body mass index is 24.42 kg/m as calculated from the following:   Height as of 04/25/22: 5\' 9"  (1.753 m).   Weight as of 04/25/22: 75 kg.   Imaging Review Plain radiographs demonstrate severe degenerative joint disease of the left hip(s). The bone quality appears to be good for age and reported activity level.      Assessment/Plan:  End stage arthritis, left hip(s)  The patient history, physical examination, clinical judgement of the provider and imaging studies are consistent with end stage degenerative joint disease of the left hip(s) and total hip arthroplasty is deemed medically necessary. The treatment options including medical management, injection therapy, arthroscopy and arthroplasty were discussed at length. The risks and benefits of total hip arthroplasty were presented and reviewed. The risks due to aseptic loosening, infection, stiffness, dislocation/subluxation,  thromboembolic complications and other imponderables were discussed.  The patient acknowledged the explanation, agreed to proceed with the plan and consent was signed. Patient is being admitted for inpatient treatment for surgery, pain control, PT, OT, prophylactic antibiotics, VTE prophylaxis, progressive ambulation and ADL's and discharge planning.The patient is planning to be discharged home with outpatient PT.    Patient's anticipated LOS is less than 2 midnights, meeting these requirements: - Younger than 58 - Lives within 1 hour of care - Has a competent adult at home to recover with post-op recover - NO history of  - Chronic pain requiring opiods  - Diabetes  - Coronary Artery Disease  - Heart failure  - Heart attack  - Stroke  - DVT/VTE  - Cardiac arrhythmia  - Respiratory Failure/COPD  - Renal failure  - Anemia  - Advanced Liver disease

## 2022-08-03 NOTE — H&P (Signed)
TOTAL HIP ADMISSION H&P  Patient is admitted for left total hip arthroplasty.  Subjective:  Chief Complaint: left hip pain  HPI: Lucas Butler, 67 y.o. male, has a history of pain and functional disability in the left hip(s) due to arthritis and patient has failed non-surgical conservative treatments for greater than 12 weeks to include NSAID's and/or analgesics, use of assistive devices, and activity modification.  Onset of symptoms was gradual starting 10 years ago with gradually worsening course since that time.The patient noted no past surgery on the left hip(s).  Patient currently rates pain in the left hip at 8 out of 10 with activity. Patient has night pain, worsening of pain with activity and weight bearing, pain that interfers with activities of daily living, and pain with passive range of motion. Patient has evidence of periarticular osteophytes and joint space narrowing by imaging studies. This condition presents safety issues increasing the risk of falls.  There is no current active infection.  There are no problems to display for this patient.  Past Medical History:  Diagnosis Date   Arthritis    hands   Fatigue    History of kidney stones 2006    Past Surgical History:  Procedure Laterality Date   CATARACT EXTRACTION Right 2021   FINGER SURGERY Right 1981   pinky   FOOT SURGERY Bilateral 1979   [facility]   TOTAL HIP ARTHROPLASTY Right 04/25/2022   Procedure: TOTAL HIP ARTHROPLASTY;  Surgeon: Joen Laura, MD;  Location: [facility];  Service: Orthopedics;  Laterality: Right;    Current Outpatient Medications  Medication Sig Dispense Refill Last Dose   Multiple Vitamin (MULTIVITAMIN WITH MINERALS) TABS tablet Take 1 tablet by mouth daily. Centrum Silver      No current facility-administered medications for this visit.   No Known Allergies  Social History   Tobacco Use   Smoking status: Every Day    Current packs/day: 0.25    Average packs/day: 0.3 packs/day for  40.0 years (10.0 ttl pk-yrs)    Types: Cigarettes   Smokeless tobacco: Not on file  Substance Use Topics   Alcohol use: Yes    Comment: 7    No family history on file.   Review of Systems  Musculoskeletal:  Positive for arthralgias.  All other systems reviewed and are negative.   Objective:  Physical Exam Constitutional:      General: He is not in acute distress.    Appearance: Normal appearance. He is normal weight.  HENT:     Head: Normocephalic and atraumatic.  Eyes:     Extraocular Movements: Extraocular movements intact.     Conjunctiva/sclera: Conjunctivae normal.     Pupils: Pupils are equal, round, and reactive to light.  Cardiovascular:     Rate and Rhythm: Normal rate and regular rhythm.     Pulses: Normal pulses.     Heart sounds: Normal heart sounds.  Pulmonary:     Effort: Pulmonary effort is normal. No respiratory distress.     Breath sounds: Normal breath sounds.  Abdominal:     General: Bowel sounds are normal. There is no distension.     Palpations: Abdomen is soft.     Tenderness: There is no abdominal tenderness.  Musculoskeletal:        General: Tenderness present.     Cervical back: Normal range of motion and neck supple.     Comments: TTP over groin, lateral aspect, greater trochanter.  Mild IT band tenderness.  No significant swelling.  No overlying lesions of area of chief complaint.  Decreased strength and ROM due to elicited pain.  Dorsiflexion and plantarflexion intact.  BLE appear grossly neurovascularly intact.  Gait moderately antalgic.   Lymphadenopathy:     Cervical: No cervical adenopathy.  Skin:    General: Skin is warm and dry.     Capillary Refill: Capillary refill takes less than 2 seconds.     Findings: No erythema or rash.  Neurological:     General: No focal deficit present.     Mental Status: He is alert and oriented to person, place, and time.  Psychiatric:        Mood and Affect: Mood normal.        Behavior: Behavior  normal.     Vital signs in last 24 hours: @VSRANGES @  Labs:   Estimated body mass index is 24.42 kg/m as calculated from the following:   Height as of 04/25/22: 5\' 9"  (1.753 m).   Weight as of 04/25/22: 75 kg.   Imaging Review Plain radiographs demonstrate severe degenerative joint disease of the left hip(s). The bone quality appears to be good for age and reported activity level.      Assessment/Plan:  End stage arthritis, left hip(s)  The patient history, physical examination, clinical judgement of the provider and imaging studies are consistent with end stage degenerative joint disease of the left hip(s) and total hip arthroplasty is deemed medically necessary. The treatment options including medical management, injection therapy, arthroscopy and arthroplasty were discussed at length. The risks and benefits of total hip arthroplasty were presented and reviewed. The risks due to aseptic loosening, infection, stiffness, dislocation/subluxation,  thromboembolic complications and other imponderables were discussed.  The patient acknowledged the explanation, agreed to proceed with the plan and consent was signed. Patient is being admitted for inpatient treatment for surgery, pain control, PT, OT, prophylactic antibiotics, VTE prophylaxis, progressive ambulation and ADL's and discharge planning.The patient is planning to be discharged home with outpatient PT.    Patient's anticipated LOS is less than 2 midnights, meeting these requirements: - Younger than 58 - Lives within 1 hour of care - Has a competent adult at home to recover with post-op recover - NO history of  - Chronic pain requiring opiods  - Diabetes  - Coronary Artery Disease  - Heart failure  - Heart attack  - Stroke  - DVT/VTE  - Cardiac arrhythmia  - Respiratory Failure/COPD  - Renal failure  - Anemia  - Advanced Liver disease

## 2022-08-07 NOTE — Progress Notes (Signed)
COVID Vaccine received:  [x]  No []  Yes Date of any COVID positive Test in last 90 days:  None  PCP - Georgianne Fick, MD Cardiologist - none  Chest x-ray -  EKG -  03-17-2022  Epic (scanned into Media in Epic- done by PCP) Stress Test -  ECHO -  Cardiac Cath -   PCR screen: [x]  Ordered & Completed           []   No Order but Needs PROFEND           []   N/A for this surgery  Surgery Plan:  [x]  Ambulatory                            []  Outpatient in bed                            []  Admit  Anesthesia:    []  General  [x]  Spinal                           []   Choice []   MAC  Pacemaker / ICD device [x]  No []  Yes   Spinal Cord Stimulator:[x]  No []  Yes       History of Sleep Apnea? [x]  No []  Yes   CPAP used?- [x]  No []  Yes    Does the patient monitor blood sugar?          []  No []  Yes  [x]  N/A  Patient has: [x]  NO Hx DM   []  Pre-DM                 []  DM1  []   DM2  Blood Thinner / Instructions: none Aspirin Instructions: stop taking BC powders 1 week prior   Patient is aware  ERAS Protocol Ordered: []  No  [x]  Yes PRE-SURGERY [x]  ENSURE  []  G2  Patient is to be NPO after: 04:30  Comments: Patient was given the 5 CHG shower / bath instructions for THA surgery along with 2 bottles of the CHG soap. Patient will start this on:  Thursday 08-18-2022   All questions were asked and answered, Patient voiced understanding of this process.   Activity level: Patient is unable to climb a flight of stairs without difficulty; [x]  No CP  [x]  No SOB, but would have leg pain. Patient can perform ADLs without assistance.   Anesthesia review: smokes,  No other pertinent medical or surgical hx.   Patient denies shortness of breath, fever, cough and chest pain at PAT appointment.  Patient verbalized understanding and agreement to the Pre-Surgical Instructions that were given to them at this PAT appointment. Patient was also educated of the need to review these PAT instructions again prior to his  surgery.I reviewed the appropriate phone numbers to call if they have any and questions or concerns.

## 2022-08-07 NOTE — Patient Instructions (Signed)
SURGICAL WAITING ROOM VISITATION Patients having surgery or a procedure may have no more than 2 support people in the waiting area - these visitors may rotate in the visitor waiting room.   Due to an increase in RSV and influenza rates and associated hospitalizations, children ages 83 and under may not visit patients in [facility] hospitals. If the patient needs to stay at the hospital during part of their recovery, the visitor guidelines for inpatient rooms apply.  PRE-OP VISITATION  Pre-op nurse will coordinate an appropriate time for 1 support person to accompany the patient in pre-op.  This support person may not rotate.  This visitor will be contacted when the time is appropriate for the visitor to come back in the pre-op area.  Please refer to the [facility] website for the visitor guidelines for Inpatients (after your surgery is over and you are in a regular room).  You are not required to quarantine at this time prior to your surgery. However, you must do this: Hand Hygiene often Do NOT share personal items Notify your provider if you are in close contact with someone who has COVID or you develop fever 100.4 or greater, new onset of sneezing, cough, sore throat, shortness of breath or body aches.  If you test positive for Covid or have been in contact with anyone that has tested positive in the last 10 days please notify you surgeon.    Your procedure is scheduled Monday  August 22, 2022  Report to [facility] Main Entrance: [address] entrance where the [employer] is available.   Report to admitting at: 05:15    AM  Call this number if you have any questions or problems the morning of surgery [phone]  Do not eat food after Midnight the night prior to your surgery/procedure.  After Midnight you may have the following liquids until   04:30  AM  DAY OF SURGERY  Clear Liquid Diet Water Black Coffee (sugar ok, NO MILK/CREAM OR CREAMERS)  Tea (sugar ok, NO  MILK/CREAM OR CREAMERS) regular and decaf                             Plain Jell-O  with no fruit (NO RED)                                           Fruit ices (not with fruit pulp, NO RED)                                     Popsicles (NO RED)                                                                  Juice: NO CITRUS JUICES: only apple, WHITE grape, WHITE cranberry Sports drinks like Gatorade or Powerade (NO RED)                   The day of surgery:  Drink ONE (1) Pre-Surgery Clear Ensure  at  04:30  AM the morning  of surgery. Drink in one sitting. Do not sip.  This drink was given to you during your hospital pre-op appointment visit. Nothing else to drink after completing the Pre-Surgery Clear Ensure  : No candy, chewing gum or throat lozenges.    FOLLOW  ANY ADDITIONAL PRE OP INSTRUCTIONS YOU RECEIVED FROM YOUR SURGEON'S OFFICE!!!   Oral Hygiene is also important to reduce your risk of infection.        Remember - BRUSH YOUR TEETH THE MORNING OF SURGERY WITH YOUR REGULAR TOOTHPASTE  Do NOT smoke after Midnight the night before surgery.  STOP TAKING all Goody Powders, Vitamins, Herbs and supplements 1 week before your surgery.   Take ONLY these medicines the morning of surgery with A SIP OF WATER: None                    You may not have any metal on your body including , jewelry, and body piercing  Do not wear  lotions, powders, cologne, or deodorant  Men may shave face and neck.  Contacts, Hearing Aids, dentures or bridgework may not be worn into surgery. DENTURES WILL BE REMOVED PRIOR TO SURGERY PLEASE DO NOT APPLY "Poly grip" OR ADHESIVES!!!  Patients discharged on the day of surgery will not be allowed to drive home.  Someone NEEDS to stay with you for the first 24 hours after anesthesia.  Do not bring your home medications to the hospital. The Pharmacy will dispense medications listed on your medication list to you during your admission in the  Hospital.  Special Instructions: Bring a copy of your healthcare power of attorney and living will documents the day of surgery, if you wish to have them scanned into your [facility] Records- EPIC  Please read over the following fact sheets you were given: IF YOU HAVE QUESTIONS ABOUT YOUR PRE-OP INSTRUCTIONS, PLEASE CALL [phone].     Pre-operative 5 CHG Bath Instructions   You can play a key role in reducing the risk of infection after surgery. Your skin needs to be as free of germs as possible. You can reduce the number of germs on your skin by washing with CHG (chlorhexidine gluconate) soap before surgery. CHG is an antiseptic soap that kills germs and continues to kill germs even after washing.   DO NOT use if you have an allergy to chlorhexidine/CHG or antibacterial soaps. If your skin becomes reddened or irritated, stop using the CHG and notify one of our RNs at [phone]  Please shower with the CHG soap starting 4 days before surgery using the following schedule: START SHOWERS ON  THURSDAY  August 18, 2022  Please keep in mind the following:  DO NOT shave, including legs and underarms, starting the day of your first shower.   You may shave your face at any point before/day of surgery.   Place clean sheets on your bed the day you start using CHG soap. Use a clean washcloth (not used since being washed) for each shower. DO NOT sleep with pets once you start using the CHG.   CHG Shower Instructions:  If you choose to wash your hair and private area, wash first with your normal shampoo/soap.  After you use shampoo/soap, rinse your hair and body thoroughly to remove shampoo/soap residue.  Turn the water OFF and apply about 3 tablespoons (45 ml) of CHG soap to a CLEAN washcloth.  Apply CHG  soap ONLY FROM YOUR NECK DOWN TO YOUR TOES (washing for 3-5 minutes)  DO NOT use CHG soap on face, private areas, open wounds, or sores.  Pay special attention to the area where your surgery is being performed.  If you are having back surgery, having someone wash your back for you may be helpful.  Wait 2 minutes after CHG soap is applied, then you may rinse off the CHG soap.  Pat dry with a clean towel  Put on clean clothes/pajamas   If you choose to wear lotion, please use ONLY the CHG-compatible lotions on the back of this paper.     Additional instructions for the day of surgery: DO NOT APPLY any lotions, deodorants, cologne, or perfumes.   Put on clean/comfortable clothes.  Brush your teeth.  Ask your nurse before applying any prescription medications to the skin.      CHG Compatible Lotions   Aveeno Moisturizing lotion  Cetaphil Moisturizing Cream  Cetaphil Moisturizing Lotion  Clairol Herbal Essence Moisturizing Lotion, Dry Skin  Clairol Herbal Essence Moisturizing Lotion, Extra Dry Skin  Clairol Herbal Essence Moisturizing Lotion, Normal Skin  Curel Age Defying Therapeutic Moisturizing Lotion with Alpha Hydroxy  Curel Extreme Care Body Lotion  Curel Soothing Hands Moisturizing Hand Lotion  Curel Therapeutic Moisturizing Cream, Fragrance-Free  Curel Therapeutic Moisturizing Lotion, Fragrance-Free  Curel Therapeutic Moisturizing Lotion, Original Formula  Eucerin Daily Replenishing Lotion  Eucerin Dry Skin Therapy Plus Alpha Hydroxy Crme  Eucerin Dry Skin Therapy Plus Alpha Hydroxy Lotion  Eucerin Original Crme  Eucerin Original Lotion  Eucerin Plus Crme Eucerin Plus Lotion  Eucerin TriLipid Replenishing Lotion  Keri Anti-Bacterial Hand Lotion  Keri Deep Conditioning Original Lotion Dry Skin Formula Softly Scented  Keri Deep Conditioning Original Lotion, Fragrance Free Sensitive Skin Formula  Keri Lotion Fast Absorbing Fragrance Free Sensitive Skin Formula  Keri  Lotion Fast Absorbing Softly Scented Dry Skin Formula  Keri Original Lotion  Keri Skin Renewal Lotion Keri Silky Smooth Lotion  Keri Silky Smooth Sensitive Skin Lotion  Nivea Body Creamy Conditioning Oil  Nivea Body Extra Enriched Lotion  Nivea Body Original Lotion  Nivea Body Sheer Moisturizing Lotion Nivea Crme  Nivea Skin Firming Lotion  NutraDerm 30 Skin Lotion  NutraDerm Skin Lotion  NutraDerm Therapeutic Skin Cream  NutraDerm Therapeutic Skin Lotion  ProShield Protective Hand Cream  Provon moisturizing lotion   FAILURE TO FOLLOW THESE INSTRUCTIONS MAY RESULT IN THE CANCELLATION OF YOUR SURGERY  PATIENT SIGNATURE_________________________________  NURSE SIGNATURE__________________________________  ________________________________________________________________________      Lucas Butler    An incentive spirometer is a tool that can help keep your lungs clear and active. This tool measures how well you are filling your lungs with each breath. Taking long  deep breaths may help reverse or decrease the chance of developing breathing (pulmonary) problems (especially infection) following: A long period of time when you are unable to move or be active. BEFORE THE PROCEDURE  If the spirometer includes an indicator to show your best effort, your nurse or respiratory therapist will set it to a desired goal. If possible, sit up straight or lean slightly forward. Try not to slouch. Hold the incentive spirometer in an upright position. INSTRUCTIONS FOR USE  Sit on the edge of your bed if possible, or sit up as far as you can in bed or on a chair. Hold the incentive spirometer in an upright position. Breathe out normally. Place the mouthpiece in your mouth and seal your lips tightly around it. Breathe in slowly and as deeply as possible, raising the piston or the ball toward the top of the column. Hold your breath for 3-5 seconds or for as long as possible. Allow the piston  or ball to fall to the bottom of the column. Remove the mouthpiece from your mouth and breathe out normally. Rest for a few seconds and repeat Steps 1 through 7 at least 10 times every 1-2 hours when you are awake. Take your time and take a few normal breaths between deep breaths. The spirometer may include an indicator to show your best effort. Use the indicator as a goal to work toward during each repetition. After each set of 10 deep breaths, practice coughing to be sure your lungs are clear. If you have an incision (the cut made at the time of surgery), support your incision when coughing by placing a pillow or rolled up towels firmly against it. Once you are able to get out of bed, walk around indoors and cough well. You may stop using the incentive spirometer when instructed by your caregiver.  RISKS AND COMPLICATIONS Take your time so you do not get dizzy or light-headed. If you are in pain, you may need to take or ask for pain medication before doing incentive spirometry. It is harder to take a deep breath if you are having pain. AFTER USE Rest and breathe slowly and easily. It can be helpful to keep track of a log of your progress. Your caregiver can provide you with a simple table to help with this. If you are using the spirometer at home, follow these instructions: SEEK MEDICAL CARE IF:  You are having difficultly using the spirometer. You have trouble using the spirometer as often as instructed. Your pain medication is not giving enough relief while using the spirometer. You develop fever of 100.5 F (38.1 C) or higher.                                                                                                    SEEK IMMEDIATE MEDICAL CARE IF:  You cough up bloody sputum that had not been present before. You develop fever of 102 F (38.9 C) or greater. You develop worsening pain at or near the incision site. MAKE SURE YOU:  Understand these instructions. Will watch your  condition. Will  get help right away if you are not doing well or get worse. Document Released: 05/09/2006 Document Revised: 03/21/2011 Document Reviewed: 07/10/2006 [facility] Patient Information 2014 [place], [place].    WHAT IS A BLOOD TRANSFUSION? Blood Transfusion Information  A transfusion is the replacement of blood or some of its parts. Blood is made up of multiple cells which provide different functions. Red blood cells carry oxygen and are used for blood loss replacement. White blood cells fight against infection. Platelets control bleeding. Plasma helps clot blood. Other blood products are available for specialized needs, such as hemophilia or other clotting disorders. BEFORE THE TRANSFUSION  Who gives blood for transfusions?  Healthy volunteers who are fully evaluated to make sure their blood is safe. This is blood bank blood. Transfusion therapy is the safest it has ever been in the practice of medicine. Before blood is taken from a donor, a complete history is taken to make sure that person has no history of diseases nor engages in risky social behavior (examples are intravenous drug use or sexual activity with multiple partners). The donor's travel history is screened to minimize risk of transmitting infections, such as malaria. The donated blood is tested for signs of infectious diseases, such as HIV and hepatitis. The blood is then tested to be sure it is compatible with you in order to minimize the chance of a transfusion reaction. If you or a relative donates blood, this is often done in anticipation of surgery and is not appropriate for emergency situations. It takes many days to process the donated blood. RISKS AND COMPLICATIONS Although transfusion therapy is very safe and saves many lives, the main dangers of transfusion include:  Getting an infectious disease. Developing a transfusion reaction. This is an allergic reaction to something in the blood you were given. Every  precaution is taken to prevent this. The decision to have a blood transfusion has been considered carefully by your caregiver before blood is given. Blood is not given unless the benefits outweigh the risks. AFTER THE TRANSFUSION Right after receiving a blood transfusion, you will usually feel much better and more energetic. This is especially true if your red blood cells have gotten low (anemic). The transfusion raises the level of the red blood cells which carry oxygen, and this usually causes an energy increase. The nurse administering the transfusion will monitor you carefully for complications. HOME CARE INSTRUCTIONS  No special instructions are needed after a transfusion. You may find your energy is better. Speak with your caregiver about any limitations on activity for underlying diseases you may have. SEEK MEDICAL CARE IF:  Your condition is not improving after your transfusion. You develop redness or irritation at the intravenous (IV) site. SEEK IMMEDIATE MEDICAL CARE IF:  Any of the following symptoms occur over the next 12 hours: Shaking chills. You have a temperature by mouth above 102 F (38.9 C), not controlled by medicine. Chest, back, or muscle pain. People around you feel you are not acting correctly or are confused. Shortness of breath or difficulty breathing. Dizziness and fainting. You get a rash or develop hives. You have a decrease in urine output. Your urine turns a dark color or changes to pink, red, or brown. Any of the following symptoms occur over the next 10 days: You have a temperature by mouth above 102 F (38.9 C), not controlled by medicine. Shortness of breath. Weakness after normal activity. The white part of the eye turns yellow (jaundice). You have a decrease in the amount  of urine or are urinating less often. Your urine turns a dark color or changes to pink, red, or brown. Document Released: 12/25/1999 Document Revised: 03/21/2011 Document Reviewed:  08/13/2007 [facility] Patient Information 2014 [place], [place].  _______________________________________________________________________

## 2022-08-09 ENCOUNTER — Other Ambulatory Visit: Payer: Self-pay

## 2022-08-09 ENCOUNTER — Encounter (HOSPITAL_COMMUNITY): Payer: Self-pay

## 2022-08-09 ENCOUNTER — Encounter (HOSPITAL_COMMUNITY)
Admission: RE | Admit: 2022-08-09 | Discharge: 2022-08-09 | Disposition: A | Discharge status: Home / Self Care | Payer: Medicare Other | Source: Ambulatory Visit | Attending: Orthopedic Surgery | Admitting: Orthopedic Surgery

## 2022-08-09 VITALS — BP 135/80 | HR 70 | Temp 98.3°F | Resp 18 | Ht 69.0 in | Wt 173.0 lb

## 2022-08-09 DIAGNOSIS — G8929 Other chronic pain: Secondary | ICD-10-CM | POA: Diagnosis not present

## 2022-08-09 DIAGNOSIS — Z01812 Encounter for preprocedural laboratory examination: Secondary | ICD-10-CM | POA: Insufficient documentation

## 2022-08-09 DIAGNOSIS — M25552 Pain in left hip: Secondary | ICD-10-CM | POA: Diagnosis not present

## 2022-08-09 DIAGNOSIS — Z01818 Encounter for other preprocedural examination: Secondary | ICD-10-CM

## 2022-08-09 HISTORY — DX: Malignant (primary) neoplasm, unspecified: C80.1

## 2022-08-09 LAB — COMPREHENSIVE METABOLIC PANEL
ALT: 14 U/L (ref 0–44)
AST: 16 U/L (ref 15–41)
Albumin: 3.9 g/dL (ref 3.5–5.0)
Alkaline Phosphatase: 85 U/L (ref 38–126)
Anion gap: 8 (ref 5–15)
BUN: 11 mg/dL (ref 8–23)
CO2: 25 mmol/L (ref 22–32)
Calcium: 9.4 mg/dL (ref 8.9–10.3)
Chloride: 108 mmol/L (ref 98–111)
Creatinine, Ser: 0.92 mg/dL (ref 0.61–1.24)
GFR, Estimated: >60 mL/min (ref >60)
Glucose, Bld: 93 mg/dL (ref 70–99)
Potassium: 4.1 mmol/L (ref 3.5–5.1)
Sodium: 141 mmol/L (ref 135–145)
Total Bilirubin: 0.4 mg/dL (ref 0.3–1.2)
Total Protein: 7 g/dL (ref 6.5–8.1)

## 2022-08-09 LAB — CBC WITH DIFFERENTIAL/PLATELET
Abs Immature Granulocytes: 0.01 10*3/uL (ref 0.00–0.07)
Basophils Absolute: 0 10*3/uL (ref 0.0–0.1)
Basophils Relative: 0 %
Eosinophils Absolute: 0.1 10*3/uL (ref 0.0–0.5)
Eosinophils Relative: 2 %
HCT: 44.5 % (ref 39.0–52.0)
Hemoglobin: 14.2 g/dL (ref 13.0–17.0)
Immature Granulocytes: 0 %
Lymphocytes Relative: 29 %
Lymphs Abs: 2.1 10*3/uL (ref 0.7–4.0)
MCH: 28.6 pg (ref 26.0–34.0)
MCHC: 31.9 g/dL (ref 30.0–36.0)
MCV: 89.5 fL (ref 80.0–100.0)
Monocytes Absolute: 0.5 10*3/uL (ref 0.1–1.0)
Monocytes Relative: 6 %
Neutro Abs: 4.3 10*3/uL (ref 1.7–7.7)
Neutrophils Relative %: 63 %
Platelets: 223 10*3/uL (ref 150–400)
RBC: 4.97 MIL/uL (ref 4.22–5.81)
RDW: 14.2 % (ref 11.5–15.5)
WBC: 7 10*3/uL (ref 4.0–10.5)
nRBC: 0 % (ref 0.0–0.2)

## 2022-08-09 LAB — TYPE AND SCREEN
ABO/RH(D): A NEG
Antibody Screen: NEGATIVE

## 2022-08-09 LAB — SURGICAL PCR SCREEN
MRSA, PCR: NEGATIVE
Staphylococcus aureus: NEGATIVE

## 2022-08-10 NOTE — Care Plan (Signed)
Ortho Bundle Case Management Note  Patient Details  Name: Lucas Butler MRN: [id number] Date of Birth: [birth year]  met with patient in the office for H&P. will discharge to home with family to assist. has rolling walker at home. OPPT set up with [facility] discharge instructions discussed and questions answered. Patient and MD in agreement with plan. Choice offered.    DME Arranged:    DME Agency:     HH Arranged:    HH Agency:     Additional Comments: Please contact me with any questions of if this plan should need to change.  Shauna Hugh,  RN,BSN,MHA,CCM  [facility] Orthopaedic Specialist  [phone] 08/10/2022, 3:07 PM

## 2022-08-21 NOTE — Anesthesia Preprocedure Evaluation (Signed)
Anesthesia Evaluation  Patient identified by MRN, date of birth, ID band Patient awake    Reviewed: Allergy & Precautions, NPO status , Patient's Chart, lab work & pertinent test results  Airway Mallampati: II  TM Distance: >3 FB Neck ROM: Full    Dental no notable dental hx.    Pulmonary Current Smoker and Patient abstained from smoking.   Pulmonary exam normal        Cardiovascular negative cardio ROS  Rhythm:Regular Rate:Normal     Neuro/Psych negative neurological ROS  negative psych ROS   GI/Hepatic negative GI ROS, Neg liver ROS,,,  Endo/Other  negative endocrine ROS    Renal/GU negative Renal ROS  negative genitourinary   Musculoskeletal  (+) Arthritis , Osteoarthritis,    Abdominal Normal abdominal exam  (+)   Peds  Hematology Lab Results      Component                Value               Date                      WBC                      7.0                 08/09/2022                HGB                      14.2                08/09/2022                HCT                      44.5                08/09/2022                MCV                      89.5                08/09/2022                PLT                      223                 08/09/2022             Lab Results      Component                Value               Date                      NA                       141                 08/09/2022                K  4.1                 08/09/2022                CO2                      25                  08/09/2022                GLUCOSE                  93                  08/09/2022                BUN                      11                  08/09/2022                CREATININE               0.92                08/09/2022                CALCIUM                  9.4                 08/09/2022                GFRNONAA                 >60                 08/09/2022               Anesthesia Other Findings   Reproductive/Obstetrics                             Anesthesia Physical Anesthesia Plan  ASA: 2  Anesthesia Plan: MAC and Spinal   Post-op Pain Management:    Induction:   PONV Risk Score and Plan: Ondansetron, Dexamethasone, Propofol infusion and Treatment may vary due to age or medical condition  Airway Management Planned: Simple Face Mask and Nasal Cannula  Additional Equipment: None  Intra-op Plan:   Post-operative Plan:   Informed Consent: I have reviewed the patients History and Physical, chart, labs and discussed the procedure including the risks, benefits and alternatives for the proposed anesthesia with the patient or authorized representative who has indicated his/her understanding and acceptance.     Dental advisory given  Plan Discussed with: CRNA  Anesthesia Plan Comments:        Anesthesia Quick Evaluation

## 2022-08-22 ENCOUNTER — Ambulatory Visit (HOSPITAL_COMMUNITY)
Admission: RE | Admit: 2022-08-22 | Discharge: 2022-08-22 | Disposition: A | Discharge status: Home / Self Care | Payer: Medicare Other | Source: Ambulatory Visit | Attending: Orthopedic Surgery | Admitting: Orthopedic Surgery

## 2022-08-22 ENCOUNTER — Other Ambulatory Visit: Payer: Self-pay

## 2022-08-22 ENCOUNTER — Ambulatory Visit (HOSPITAL_COMMUNITY): Payer: Medicare Other

## 2022-08-22 ENCOUNTER — Encounter (HOSPITAL_COMMUNITY): Payer: Self-pay | Admitting: Orthopedic Surgery

## 2022-08-22 ENCOUNTER — Ambulatory Visit (HOSPITAL_BASED_OUTPATIENT_CLINIC_OR_DEPARTMENT_OTHER): Payer: Medicare Other | Admitting: Anesthesiology

## 2022-08-22 ENCOUNTER — Ambulatory Visit (HOSPITAL_COMMUNITY): Payer: Medicare Other | Admitting: Anesthesiology

## 2022-08-22 ENCOUNTER — Encounter (HOSPITAL_COMMUNITY)
Admission: RE | Disposition: A | Discharge status: Home / Self Care | Payer: Self-pay | Source: Ambulatory Visit | Attending: Orthopedic Surgery

## 2022-08-22 DIAGNOSIS — Z471 Aftercare following joint replacement surgery: Secondary | ICD-10-CM | POA: Diagnosis not present

## 2022-08-22 DIAGNOSIS — M1612 Unilateral primary osteoarthritis, left hip: Secondary | ICD-10-CM | POA: Insufficient documentation

## 2022-08-22 DIAGNOSIS — F17210 Nicotine dependence, cigarettes, uncomplicated: Secondary | ICD-10-CM | POA: Insufficient documentation

## 2022-08-22 DIAGNOSIS — Z96642 Presence of left artificial hip joint: Secondary | ICD-10-CM | POA: Diagnosis not present

## 2022-08-22 HISTORY — PX: TOTAL HIP ARTHROPLASTY: SHX124

## 2022-08-22 SURGERY — ARTHROPLASTY, HIP, TOTAL,POSTERIOR APPROACH
Anesthesia: Monitor Anesthesia Care | Site: Hip | Laterality: Left

## 2022-08-22 MED ORDER — CHLORHEXIDINE GLUCONATE 0.12 % MT SOLN
15.0000 mL | Freq: Once | OROMUCOSAL | Status: AC
  Administered 2022-08-22: 15 mL via OROMUCOSAL

## 2022-08-22 MED ORDER — KETOROLAC TROMETHAMINE 15 MG/ML IJ SOLN
INTRAMUSCULAR | Status: AC
  Filled 2022-08-22: qty 1

## 2022-08-22 MED ORDER — POVIDONE-IODINE 10 % EX SWAB: 2.0000 | Freq: Once | CUTANEOUS | Status: DC

## 2022-08-22 MED ORDER — CEFAZOLIN SODIUM-DEXTROSE 2-4 GM/100ML-% IV SOLN
2.0000 g | INTRAVENOUS | Status: AC
  Administered 2022-08-22: 2 g via INTRAVENOUS
  Filled 2022-08-22: qty 100

## 2022-08-22 MED ORDER — PHENYLEPHRINE HCL-NACL 20-0.9 MG/250ML-% IV SOLN
INTRAVENOUS | Status: DC | PRN
  Administered 2022-08-22: 25 ug/min via INTRAVENOUS

## 2022-08-22 MED ORDER — ONDANSETRON HCL 4 MG PO TABS: 4.0000 mg | ORAL_TABLET | Freq: Three times a day (TID) | ORAL | 0 refills | Status: AC | PRN

## 2022-08-22 MED ORDER — ONDANSETRON HCL 4 MG/2ML IJ SOLN: 4.0000 mg | Freq: Four times a day (QID) | INTRAMUSCULAR | Status: DC | PRN

## 2022-08-22 MED ORDER — HYDROMORPHONE HCL 1 MG/ML IJ SOLN: 0.5000 mg | INTRAMUSCULAR | Status: DC | PRN

## 2022-08-22 MED ORDER — LACTATED RINGERS IV BOLUS
500.0000 mL | Freq: Once | INTRAVENOUS | Status: AC
  Administered 2022-08-22: 500 mL via INTRAVENOUS

## 2022-08-22 MED ORDER — METHOCARBAMOL 500 MG PO TABS: 500.0000 mg | ORAL_TABLET | Freq: Four times a day (QID) | ORAL | Status: DC | PRN

## 2022-08-22 MED ORDER — ACETAMINOPHEN 500 MG PO TABS: 1000.0000 mg | ORAL_TABLET | Freq: Three times a day (TID) | ORAL | Status: AC | PRN

## 2022-08-22 MED ORDER — FENTANYL CITRATE PF 50 MCG/ML IJ SOSY
PREFILLED_SYRINGE | INTRAMUSCULAR | Status: AC
  Administered 2022-08-22: 50 ug via INTRAVENOUS
  Filled 2022-08-22: qty 2

## 2022-08-22 MED ORDER — DEXAMETHASONE SODIUM PHOSPHATE 10 MG/ML IJ SOLN
INTRAMUSCULAR | Status: AC
  Filled 2022-08-22: qty 1

## 2022-08-22 MED ORDER — CELECOXIB 100 MG PO CAPS: 100.0000 mg | ORAL_CAPSULE | Freq: Two times a day (BID) | ORAL | 0 refills | Status: AC

## 2022-08-22 MED ORDER — ACETAMINOPHEN 500 MG PO TABS: 1000.0000 mg | ORAL_TABLET | Freq: Four times a day (QID) | ORAL | Status: DC

## 2022-08-22 MED ORDER — PHENYLEPHRINE HCL (PRESSORS) 10 MG/ML IV SOLN
INTRAVENOUS | Status: AC
  Filled 2022-08-22: qty 1

## 2022-08-22 MED ORDER — LACTATED RINGERS IV BOLUS
250.0000 mL | Freq: Once | INTRAVENOUS | Status: AC
  Administered 2022-08-22: 250 mL via INTRAVENOUS

## 2022-08-22 MED ORDER — ACETAMINOPHEN 500 MG PO TABS
1000.0000 mg | ORAL_TABLET | Freq: Once | ORAL | Status: AC
  Administered 2022-08-22: 1000 mg via ORAL
  Filled 2022-08-22: qty 2

## 2022-08-22 MED ORDER — OXYCODONE HCL 5 MG PO TABS
5.0000 mg | ORAL_TABLET | ORAL | Status: DC | PRN
  Administered 2022-08-22: 10 mg via ORAL

## 2022-08-22 MED ORDER — ACETAMINOPHEN 325 MG PO TABS: 325.0000 mg | ORAL_TABLET | Freq: Four times a day (QID) | ORAL | Status: DC | PRN

## 2022-08-22 MED ORDER — OXYCODONE HCL 5 MG PO TABS
ORAL_TABLET | ORAL | Status: AC
  Filled 2022-08-22: qty 2

## 2022-08-22 MED ORDER — BUPIVACAINE IN DEXTROSE 0.75-8.25 % IT SOLN
INTRATHECAL | Status: DC | PRN
  Administered 2022-08-22: 1.8 mL via INTRATHECAL

## 2022-08-22 MED ORDER — KETOROLAC TROMETHAMINE 15 MG/ML IJ SOLN: 7.5000 mg | Freq: Four times a day (QID) | INTRAMUSCULAR | Status: DC

## 2022-08-22 MED ORDER — OMEPRAZOLE 40 MG PO CPDR: 40.0000 mg | DELAYED_RELEASE_CAPSULE | Freq: Every day | ORAL | 0 refills | Status: AC

## 2022-08-22 MED ORDER — OXYCODONE HCL 5 MG PO TABS: 5.0000 mg | ORAL_TABLET | ORAL | 0 refills | Status: AC | PRN

## 2022-08-22 MED ORDER — TRANEXAMIC ACID-NACL 1000-0.7 MG/100ML-% IV SOLN
1000.0000 mg | INTRAVENOUS | Status: AC
  Administered 2022-08-22: 1000 mg via INTRAVENOUS
  Filled 2022-08-22: qty 100

## 2022-08-22 MED ORDER — ONDANSETRON HCL 4 MG/2ML IJ SOLN
INTRAMUSCULAR | Status: AC
  Filled 2022-08-22: qty 2

## 2022-08-22 MED ORDER — BUPIVACAINE-EPINEPHRINE 0.25% -1:200000 IJ SOLN
INTRAMUSCULAR | Status: DC | PRN
  Administered 2022-08-22: 30 mL

## 2022-08-22 MED ORDER — ACETAMINOPHEN 500 MG PO TABS
ORAL_TABLET | ORAL | Status: AC
  Administered 2022-08-22: 1000 mg via ORAL
  Filled 2022-08-22: qty 2

## 2022-08-22 MED ORDER — BUPIVACAINE LIPOSOME 1.3 % IJ SUSP
INTRAMUSCULAR | Status: AC
  Filled 2022-08-22: qty 20

## 2022-08-22 MED ORDER — LACTATED RINGERS IV SOLN: INTRAVENOUS | Status: DC

## 2022-08-22 MED ORDER — PROPOFOL 10 MG/ML IV BOLUS
INTRAVENOUS | Status: DC | PRN
  Administered 2022-08-22: 30 mg via INTRAVENOUS
  Administered 2022-08-22: 40 mg via INTRAVENOUS
  Administered 2022-08-22: 50 ug/kg/min via INTRAVENOUS

## 2022-08-22 MED ORDER — ISOPROPYL ALCOHOL 70 % SOLN
Status: DC | PRN
  Administered 2022-08-22: 1 via TOPICAL

## 2022-08-22 MED ORDER — BUPIVACAINE-EPINEPHRINE 0.25% -1:200000 IJ SOLN
INTRAMUSCULAR | Status: AC
  Filled 2022-08-22: qty 1

## 2022-08-22 MED ORDER — SODIUM CHLORIDE (PF) 0.9 % IJ SOLN
INTRAMUSCULAR | Status: DC | PRN
  Administered 2022-08-22: 30 mL

## 2022-08-22 MED ORDER — DEXAMETHASONE SODIUM PHOSPHATE 10 MG/ML IJ SOLN
8.0000 mg | Freq: Once | INTRAMUSCULAR | Status: AC
  Administered 2022-08-22: 8 mg via INTRAVENOUS

## 2022-08-22 MED ORDER — POLYETHYLENE GLYCOL 3350 17 G PO PACK: 17.0000 g | PACK | Freq: Every day | ORAL | 0 refills | Status: AC

## 2022-08-22 MED ORDER — 0.9 % SODIUM CHLORIDE (POUR BTL) OPTIME
TOPICAL | Status: DC | PRN
  Administered 2022-08-22: 1000 mL

## 2022-08-22 MED ORDER — SODIUM CHLORIDE (PF) 0.9 % IJ SOLN
INTRAMUSCULAR | Status: AC
  Filled 2022-08-22: qty 50

## 2022-08-22 MED ORDER — AMISULPRIDE (ANTIEMETIC) 5 MG/2ML IV SOLN: 10.0000 mg | Freq: Once | INTRAVENOUS | Status: DC | PRN

## 2022-08-22 MED ORDER — WATER FOR IRRIGATION, STERILE IR SOLN
Status: DC | PRN
  Administered 2022-08-22: 2000 mL

## 2022-08-22 MED ORDER — BUPIVACAINE LIPOSOME 1.3 % IJ SUSP
INTRAMUSCULAR | Status: DC | PRN
  Administered 2022-08-22: 20 mL

## 2022-08-22 MED ORDER — FENTANYL CITRATE PF 50 MCG/ML IJ SOSY
25.0000 ug | PREFILLED_SYRINGE | INTRAMUSCULAR | Status: DC | PRN
  Administered 2022-08-22: 50 ug via INTRAVENOUS

## 2022-08-22 MED ORDER — ONDANSETRON HCL 4 MG PO TABS: 4.0000 mg | ORAL_TABLET | Freq: Four times a day (QID) | ORAL | Status: DC | PRN

## 2022-08-22 MED ORDER — METHOCARBAMOL 500 MG IVPB - SIMPLE MED: 500.0000 mg | Freq: Four times a day (QID) | INTRAVENOUS | Status: DC | PRN

## 2022-08-22 MED ORDER — KETOROLAC TROMETHAMINE 30 MG/ML IJ SOLN
INTRAMUSCULAR | Status: AC
  Filled 2022-08-22: qty 1

## 2022-08-22 MED ORDER — SODIUM CHLORIDE 0.9 % IR SOLN
Status: DC | PRN
  Administered 2022-08-22: 3000 mL

## 2022-08-22 MED ORDER — ORAL CARE MOUTH RINSE: 15.0000 mL | Freq: Once | OROMUCOSAL | Status: AC

## 2022-08-22 MED ORDER — ONDANSETRON HCL 4 MG/2ML IJ SOLN
INTRAMUSCULAR | Status: DC | PRN
  Administered 2022-08-22: 4 mg via INTRAVENOUS

## 2022-08-22 MED ORDER — METHOCARBAMOL 500 MG PO TABS: 500.0000 mg | ORAL_TABLET | Freq: Three times a day (TID) | ORAL | 0 refills | Status: AC | PRN

## 2022-08-22 MED ORDER — BUPIVACAINE LIPOSOME 1.3 % IJ SUSP: 10.0000 mL | Freq: Once | INTRAMUSCULAR | Status: DC

## 2022-08-22 MED ORDER — CEFAZOLIN SODIUM-DEXTROSE 2-4 GM/100ML-% IV SOLN: 2.0000 g | Freq: Four times a day (QID) | INTRAVENOUS | Status: DC

## 2022-08-22 MED ORDER — MIDAZOLAM HCL 2 MG/2ML IJ SOLN
INTRAMUSCULAR | Status: AC
  Filled 2022-08-22: qty 2

## 2022-08-22 MED ORDER — SODIUM CHLORIDE 0.9 % IV SOLN: INTRAVENOUS | Status: DC

## 2022-08-22 MED ORDER — ASPIRIN 81 MG PO TBEC: 81.0000 mg | DELAYED_RELEASE_TABLET | Freq: Two times a day (BID) | ORAL | Status: AC

## 2022-08-22 SURGICAL SUPPLY — 75 items
ADH SKN CLS APL DERMABOND .7 (GAUZE/BANDAGES/DRESSINGS) ×1
APL PRP STRL LF DISP 70% ISPRP (MISCELLANEOUS) ×2
BAG COUNTER SPONGE SURGICOUNT (BAG) IMPLANT
BAG SPEC THK2 15X12 ZIP CLS (MISCELLANEOUS) ×1
BAG SPNG CNTER NS LX DISP (BAG)
BAG ZIPLOCK 12X15 (MISCELLANEOUS) ×1 IMPLANT
BLADE SAW SAG 25X90X1.19 (BLADE) ×1 IMPLANT
CHLORAPREP W/TINT 26 (MISCELLANEOUS) ×2 IMPLANT
CNTNR URN SCR LID CUP LEK RST (MISCELLANEOUS) ×1 IMPLANT
CONT SPEC 4OZ STRL OR WHT (MISCELLANEOUS) ×1
COVER SURGICAL LIGHT HANDLE (MISCELLANEOUS) ×1 IMPLANT
DERMABOND ADVANCED .7 DNX12 (GAUZE/BANDAGES/DRESSINGS) ×1 IMPLANT
DRAPE HIP W/POCKET STRL (MISCELLANEOUS) ×1 IMPLANT
DRAPE INCISE IOBAN 66X45 STRL (DRAPES) ×1 IMPLANT
DRAPE INCISE IOBAN 85X60 (DRAPES) ×1 IMPLANT
DRAPE POUCH INSTRU U-SHP 10X18 (DRAPES) ×1 IMPLANT
DRAPE SHEET LG 3/4 BI-LAMINATE (DRAPES) ×3 IMPLANT
DRAPE U-SHAPE 47X51 STRL (DRAPES) ×2 IMPLANT
DRESSING AQUACEL AG SP 3.5X10 (GAUZE/BANDAGES/DRESSINGS) ×1 IMPLANT
DRSG AQUACEL AG ADV 3.5X10 (GAUZE/BANDAGES/DRESSINGS) IMPLANT
DRSG AQUACEL AG SP 3.5X10 (GAUZE/BANDAGES/DRESSINGS) ×1
ELECT BLADE TIP CTD 4 INCH (ELECTRODE) ×1 IMPLANT
ELECT REM PT RETURN 15FT ADLT (MISCELLANEOUS) ×1 IMPLANT
GAUZE SPONGE 4X4 12PLY STRL (GAUZE/BANDAGES/DRESSINGS) ×1 IMPLANT
GLOVE BIO SURGEON STRL SZ 6.5 (GLOVE) ×2 IMPLANT
GLOVE BIOGEL PI IND STRL 6.5 (GLOVE) ×1 IMPLANT
GLOVE BIOGEL PI IND STRL 8 (GLOVE) ×1 IMPLANT
GLOVE SURG ORTHO 8.0 STRL STRW (GLOVE) ×2 IMPLANT
GOWN STRL REUS W/ TWL XL LVL3 (GOWN DISPOSABLE) ×2 IMPLANT
GOWN STRL REUS W/TWL XL LVL3 (GOWN DISPOSABLE) ×2
HANDPIECE INTERPULSE COAX TIP (DISPOSABLE) ×1
HEAD FEMORAL HIP (Head) IMPLANT
HOLDER FOLEY CATH W/STRAP (MISCELLANEOUS) ×1 IMPLANT
HOOD PEEL AWAY T7 (MISCELLANEOUS) ×3 IMPLANT
INSERT 0 DEG POLY 36 F (Miscellaneous) IMPLANT
KIT BASIN OR (CUSTOM PROCEDURE TRAY) ×1 IMPLANT
KIT TURNOVER KIT A (KITS) IMPLANT
MANIFOLD NEPTUNE II (INSTRUMENTS) ×1 IMPLANT
MARKER SKIN DUAL TIP RULER LAB (MISCELLANEOUS) ×1 IMPLANT
NDL SAFETY ECLIP 18X1.5 (MISCELLANEOUS) ×2 IMPLANT
NS IRRIG 1000ML POUR BTL (IV SOLUTION) ×1 IMPLANT
PACK TOTAL JOINT (CUSTOM PROCEDURE TRAY) ×1 IMPLANT
PAD ARMBOARD 7.5X6 YLW CONV (MISCELLANEOUS) ×1 IMPLANT
PRESSURIZER FEMORAL UNIV (MISCELLANEOUS) IMPLANT
PROTECTOR NERVE ULNAR (MISCELLANEOUS) IMPLANT
RETRIEVER SUT HEWSON (MISCELLANEOUS) ×1 IMPLANT
SCREW HEX LP 6.5X25 (Screw) IMPLANT
SCREW HEX LP 6.5X30 (Screw) IMPLANT
SEALER BIPOLAR AQUA 6.0 (INSTRUMENTS) IMPLANT
SET HNDPC FAN SPRY TIP SCT (DISPOSABLE) IMPLANT
SHELL ACETAB TRIDENT 58 (Shell) IMPLANT
SHIELD FACE FULL FLUID (MISCELLANEOUS) ×1 IMPLANT
SOLUTION IRRIG SURGIPHOR (IV SOLUTION) IMPLANT
SPIKE FLUID TRANSFER (MISCELLANEOUS) ×1 IMPLANT
STEM ACCOLADE II SZ8 (Stem) IMPLANT
STRIP CLOSURE SKIN 1/2X4 (GAUZE/BANDAGES/DRESSINGS) IMPLANT
SUCTION TUBE FRAZIER 12FR DISP (SUCTIONS) ×1 IMPLANT
SUT BONE WAX W31G (SUTURE) ×1 IMPLANT
SUT ETHIBOND #5 BRAIDED 30INL (SUTURE) ×1 IMPLANT
SUT MNCRL AB 3-0 PS2 18 (SUTURE) ×1 IMPLANT
SUT STRATAFIX 0 PDS 27 VIOLET (SUTURE)
SUT STRATAFIX PDO 1 14 VIOLET (SUTURE) ×1
SUT STRATFX PDO 1 14 VIOLET (SUTURE) ×1
SUT VIC AB 2-0 CT2 27 (SUTURE) ×2 IMPLANT
SUT VLOC 180 ABS0 18IN GS21 (SUTURE) IMPLANT
SUTURE STRATFX 0 PDS 27 VIOLET (SUTURE) ×1 IMPLANT
SUTURE STRATFX PDO 1 14 VIOLET (SUTURE) ×1 IMPLANT
SYR 30ML LL (SYRINGE) ×1 IMPLANT
SYR 50ML LL SCALE MARK (SYRINGE) ×1 IMPLANT
TOWEL OR 17X26 10 PK STRL BLUE (TOWEL DISPOSABLE) ×1 IMPLANT
TOWER CARTRIDGE SMART MIX (DISPOSABLE) IMPLANT
TRAY FOLEY MTR SLVR 16FR STAT (SET/KITS/TRAYS/PACK) IMPLANT
TUBE SUCTION HIGH CAP CLEAR NV (SUCTIONS) ×1 IMPLANT
UNDERPAD 30X36 HEAVY ABSORB (UNDERPADS AND DIAPERS) ×1 IMPLANT
WATER STERILE IRR 1000ML POUR (IV SOLUTION) ×2 IMPLANT

## 2022-08-22 NOTE — Evaluation (Signed)
Physical Therapy Evaluation Patient Details Name: Lucas Butler MRN: [id number] DOB: Jul 30, 1955 Today's Date: 08/22/2022  History of Present Illness  67 yo male presents to therapy s/p L THA posterior lateral approach on 08/22/2022 due to failure of conservative measures. Pt is currently LLE WBAT and no formal hip precautions. Pt PMH includes but is not limited to: tobacco abuse, hx of kidney stones and R THA on 04/25/2022.  Clinical Impression      Lucas Butler is a 67 y.o. male POD 0 s/p L THA, posterior lateral approach. Patient reports mod I at [facility] for uneven surfaces with mobility at baseline. Patient is now limited by functional impairments (see PT problem list below) and requires min guard and cues for transfers and gait with RW. Patient was able to ambulate 45 x2 feet with RW and min guard and cues for safe walker management and initiated on more normalized gait pattern. Patient educated on safe sequencing for stair mobility, car tranfers with running board on SUV, bed transfers, pain management including CP/Ice pt and wife verbalized understanding of safe guarding position for people assisting with mobility. Patient instructed in exercises to facilitate ROM and circulation reviewed and HO provided. Patient will benefit from continued skilled PT interventions to address impairments and progress towards PLOF. Patient has met mobility goals at adequate level for discharge home with family support and pt states OPPT scheduled 8/16; will continue to follow if pt continues acute stay to progress towards Mod I goals.     If plan is discharge home, recommend the following: A little help with walking and/or transfers;A little help with bathing/dressing/bathroom;Assistance with cooking/housework;Assist for transportation;Help with stairs or ramp for entrance   Can travel by private vehicle        Equipment Recommendations None recommended by PT  Recommendations for Other Services        Functional Status Assessment Patient has had a recent decline in their functional status and demonstrates the ability to make significant improvements in function in a reasonable and predictable amount of time.     Precautions / Restrictions Precautions Precautions: Fall Restrictions Weight Bearing Restrictions: No      Mobility  Bed Mobility Overal bed mobility: Needs Assistance Bed Mobility: Supine to Sit     Supine to sit: Supervision     General bed mobility comments: min cues    Transfers Overall transfer level: Needs assistance Equipment used: Rolling walker (2 wheels) Transfers: Sit to/from Stand Sit to Stand: Contact guard assist           General transfer comment: min cues for proper UE placement    Ambulation/Gait Ambulation/Gait assistance: Contact guard assist Gait Distance (Feet): 45 Feet Assistive device: Rolling walker (2 wheels) Gait Pattern/deviations: Step-to pattern, Trunk flexed, Antalgic       General Gait Details: min cues for proper LE placement with turns, anterior and retrograde stepping patterns  Stairs Stairs: Yes Stairs assistance: Contact guard assist Stair Management: One rail Right Number of Stairs: 3 General stair comments: cues for technique and sequencing with use of SPC and R UE on handrail to emulate use of door frame  Wheelchair Mobility     Tilt Bed    Modified Rankin (Stroke Patients Only)       Balance Overall balance assessment: Needs assistance Sitting-balance support: Feet supported Sitting balance-Leahy Scale: Good     Standing balance support: Bilateral upper extremity supported, During functional activity, Reliant on assistive device for balance Standing balance-Leahy Scale: Poor  Pertinent Vitals/Pain Pain Assessment Pain Assessment: 0-10 Pain Score: 7  Pain Location: L hip Pain Descriptors / Indicators: Aching, Discomfort, Constant, Operative site  guarding Pain Intervention(s): Limited activity within patient's tolerance, Monitored during session, Premedicated before session, Repositioned, Ice applied    Home Living Family/patient expects to be discharged to:: Private residence Living Arrangements: Spouse/significant other;Children Available Help at Discharge: Family Type of Home: House Home Access: Stairs to enter Entrance Stairs-Rails: None Entrance Stairs-Number of Steps: 3   Home Layout: One level Home Equipment: Rollator (4 wheels);Cane - single Librarian, academic (2 wheels)      Prior Function Prior Level of Function : Independent/Modified Independent             Mobility Comments: mod I with SPC on uneven surfaces for all ADLs, self care tasks, IADLs and driving       Extremity/Trunk Assessment        Lower Extremity Assessment Lower Extremity Assessment: LLE deficits/detail LLE Deficits / Details: ankle DF/PF 5/5 LLE Sensation: WNL    Cervical / Trunk Assessment Cervical / Trunk Assessment:  (wfl)  Communication   Communication Communication: No apparent difficulties  Cognition Arousal: Alert Behavior During Therapy: WFL for tasks assessed/performed Overall Cognitive Status: Within Functional Limits for tasks assessed                                          General Comments      Exercises Total Joint Exercises Ankle Circles/Pumps: AROM, Both, 15 reps Quad Sets: AROM, Left, 5 reps Gluteal Sets: AROM, Both, 5 reps Heel Slides: AROM, Left, 5 reps Hip ABduction/ADduction: AROM, Left, 5 reps, Standing Long Arc Quad: AROM, Left, 5 reps, Seated Knee Flexion: AROM, Left, 5 reps, Standing Standing Hip Extension: AROM, Left, 5 reps   Assessment/Plan    PT Assessment Patient needs continued PT services  PT Problem List Decreased strength;Decreased range of motion;Decreased activity tolerance;Decreased balance;Decreased mobility;Decreased coordination;Pain       PT Treatment  Interventions DME instruction;Gait training;Stair training;Functional mobility training;Therapeutic activities;Therapeutic exercise;Balance training;Neuromuscular re-education;Patient/family education;Modalities    PT Goals (Current goals can be found in the Care Plan section)  Acute Rehab PT Goals Patient Stated Goal: to be able to keep up with the grandchildren PT Goal Formulation: With patient Time For Goal Achievement: 09/05/22 Potential to Achieve Goals: Good    Frequency 7X/week     Co-evaluation               AM-PAC PT "6 Clicks" Mobility  Outcome Measure Help needed turning from your back to your side while in a flat bed without using bedrails?: A Little Help needed moving from lying on your back to sitting on the side of a flat bed without using bedrails?: A Little Help needed moving to and from a bed to a chair (including a wheelchair)?: A Little Help needed standing up from a chair using your arms (e.g., wheelchair or bedside chair)?: A Little Help needed to walk in hospital room?: A Little Help needed climbing 3-5 steps with a railing? : A Little 6 Click Score: 18    End of Session Equipment Utilized During Treatment: Gait belt Activity Tolerance: Patient tolerated treatment well Patient left: in chair;with call bell/phone within reach;with family/visitor present Nurse Communication: Mobility status;Other (comment) (pt readiness for d/c from PT standpoint) PT Visit Diagnosis: Unsteadiness on feet (R26.81);Other abnormalities of gait and mobility (R26.89);Muscle  weakness (generalized) (M62.81);Difficulty in walking, not elsewhere classified (R26.2);Pain Pain - Right/Left: Left Pain - part of body: Hip;Leg    Time: 1240-1318 PT Time Calculation (min) (ACUTE ONLY): 38 min   Charges:   PT Evaluation $PT Eval Low Complexity: 1 Low PT Treatments $Gait Training: 8-22 mins $Therapeutic Exercise: 8-22 mins PT General Charges $$ ACUTE PT VISIT: 1 Visit          Johnny Bridge, PT Acute Rehab   Jacqualyn Posey 08/22/2022, 2:17 PM

## 2022-08-22 NOTE — Discharge Instructions (Signed)

## 2022-08-22 NOTE — Anesthesia Procedure Notes (Signed)
Spinal  Patient location during procedure: OR Start time: 08/22/2022 7:31 AM End time: 08/22/2022 7:33 AM Staffing Performed: anesthesiologist  Anesthesiologist: Atilano Median, DO Performed by: Atilano Median, DO Authorized by: Atilano Median, DO   Preanesthetic Checklist Completed: patient identified, IV checked, site marked, risks and benefits discussed, surgical consent, monitors and equipment checked, pre-op evaluation and timeout performed Spinal Block Patient position: sitting Prep: DuraPrep Patient monitoring: heart rate, cardiac monitor, continuous pulse ox and blood pressure Approach: midline Location: L3-4 Injection technique: single-shot Needle Needle type: Pencan  Needle gauge: 24 G Needle length: 10 cm Assessment Events: CSF return Additional Notes Patient identified. Risks/Benefits/Options discussed with patient including but not limited to bleeding, infection, nerve damage, paralysis, failed block, incomplete pain control, headache, blood pressure changes, nausea, vomiting, reactions to medications, itching and postpartum back pain. Confirmed with bedside nurse the patient's most recent platelet count. Confirmed with patient that they are not currently taking any anticoagulation, have any bleeding history or any family history of bleeding disorders. Patient expressed understanding and wished to proceed. All questions were answered. Sterile technique was used throughout the entire procedure. Please see nursing notes for vital signs. Warning signs of high block given to the patient including shortness of breath, tingling/numbness in hands, complete motor block, or any concerning symptoms with instructions to call for help. Patient was given instructions on fall risk and not to get out of bed. All questions and concerns addressed with instructions to call with any issues or inadequate analgesia.

## 2022-08-22 NOTE — Interval H&P Note (Signed)
The patient has been re-examined, and the chart reviewed, and there have been no interval changes to the documented history and physical.    Plan for L THA for L hip OA  The operative side was examined and the patient was confirmed to have sensation to DPN, SPN, TN intact, Motor EHL, ext, flex 5/5, and DP 2+, PT 2+, No significant edema.   The risks, benefits, and alternatives have been discussed at length with patient, and the patient is willing to proceed.  Left hip marked. Consent has been signed.

## 2022-08-22 NOTE — Anesthesia Postprocedure Evaluation (Signed)
Anesthesia Post Note  Patient: Lucas Butler  Procedure(s) Performed: LEFT TOTAL HIP ARTHROPLASTY (Left: Hip)     Patient location during evaluation: PACU Anesthesia Type: MAC and Spinal Level of consciousness: awake and alert Pain management: pain level controlled Vital Signs Assessment: post-procedure vital signs reviewed and stable Respiratory status: spontaneous breathing, nonlabored ventilation, respiratory function stable and patient connected to nasal cannula oxygen Cardiovascular status: stable and blood pressure returned to baseline Postop Assessment: no apparent nausea or vomiting Anesthetic complications: no   No notable events documented.  Last Vitals:  Vitals:   08/22/22 1200 08/22/22 1340  BP: (!) 143/91 116/82  Pulse: 93 94  Resp: 15   Temp:    SpO2: 99% 97%    Last Pain:  Vitals:   08/22/22 1340  TempSrc:   PainSc: 3                  Calli Bashor P Ramal Eckhardt

## 2022-08-22 NOTE — Transfer of Care (Signed)
Immediate Anesthesia Transfer of Care Note  Patient: Lucas Butler  Procedure(s) Performed: LEFT TOTAL HIP ARTHROPLASTY (Left: Hip)  Patient Location: PACU  Anesthesia Type:MAC combined with regional for post-op pain  Level of Consciousness: oriented, drowsy, and patient cooperative  Airway & Oxygen Therapy: Patient Spontanous Breathing and Patient connected to face mask oxygen  Post-op Assessment: Report given to RN and Post -op Vital signs reviewed and stable  Post vital signs: stable  Last Vitals:  Vitals Value Taken Time  BP 107/73 08/22/22 1000  Temp    Pulse 82 08/22/22 1000  Resp 19 08/22/22 1000  SpO2 98 % 08/22/22 1000  Vitals shown include unfiled device data.  Last Pain:  Vitals:   08/22/22 0548  TempSrc: Oral         Complications: No notable events documented.

## 2022-08-22 NOTE — Op Note (Signed)
08/22/2022  9:25 AM  PATIENT:  Lucas Butler   MRN: [id number]  PRE-OPERATIVE DIAGNOSIS: End-stage left hip osteoarthritis  POST-OPERATIVE DIAGNOSIS:  same  PROCEDURE:  Procedure(s): LEFT TOTAL HIP ARTHROPLASTY  PREOPERATIVE INDICATIONS:   Lucas Butler is an 67 y.o. male who has a diagnosis of End-stage left hip osteoarthritis and elected for surgical management after failing conservative treatment.  The risks benefits and alternatives were discussed with the patient including but not limited to the risks of nonoperative treatment, versus surgical intervention including infection, bleeding, nerve injury, periprosthetic fracture, the need for revision surgery, dislocation, leg length discrepancy, blood clots, cardiopulmonary complications, morbidity, mortality, among others, and they were willing to proceed.     OPERATIVE REPORT     SURGEON:  Weber Cooks, MD    ASSISTANT: Kathie Dike, PA-C, (Present throughout the entire procedure,  necessary for completion of procedure in a timely manner, assisting with retraction, instrumentation, and closure)     ANESTHESIA: spinal  ESTIMATED BLOOD LOSS: 500cc    COMPLICATIONS:  None.     UNIQUE ASPECTS OF THE CASE: Severe peri-articular osteophytosis  COMPONENTS:   Implant Name Type Inv. Item Serial No. Manufacturer Lot No. LRB No. Used Action  SHELL ACETAB TRIDENT 58 - [id number] Shell SHELL ACETAB TRIDENT 58  [employer] [id number] Left 1 Implanted  SCREW HEX LP 6.5X30 - [id number] Screw SCREW HEX LP 6.5X30  [employer] [id number] Left 1 Implanted  SCREW HEX LP 6.5X25 - [id number] Screw SCREW HEX LP 6.5X25  [employer] [id number] Left 1 Implanted  INSERT 0 DEG POLY 36 F - [id number] Miscellaneous INSERT 0 DEG POLY 36 F  [employer] [id number] Left 1 Implanted  STEM ACCOLADE II SZ8 - [id number] Stem STEM Louisa Second  [employer] [id number] Left 1 Implanted  HEAD FEMORAL HIP - [id number] Head HEAD FEMORAL  HIP  [employer] [id number] Left 1 Implanted      PROCEDURE IN DETAIL:  The patient was met in the holding area and  identified.  The appropriate hip was identified and marked at the operative site.  The patient was then transported to the OR  and  placed under anesthesia.  At that point, the patient was  placed in the lateral decubitus position with the operative side up and  secured to the operating room table  and all bony prominences padded. A subaxillary role was also placed.    The operative lower extremity was prepped from the iliac crest to the distal leg.  Sterile draping was performed.  Preoperative antibiotics, 2 gm of ancef,1 gm of Tranexamic Acid, and 8 mg of Decadron administered. Time out was performed prior to incision.      A routine posterolateral approach was utilized via sharp dissection  carried down to the subcutaneous tissue.  Gross bleeders were Bovie coagulated.  The iliotibial band was identified and incised along the length of the skin incision through the glute max fascia.  Charnley retractor was placed with care to protect the sciatic nerve posteriorly.  With the hip internally rotated, the piriformis tendon was identified and released from the femoral insertion and tagged with a #5 Ethibond.  A capsulotomy was then performed off the femoral insertion and also tagged with a #5 Ethibond.    The femoral neck was exposed, and I resected the femoral neck based on preoperative templating relative to the lesser trochanter.    I then exposed the deep acetabulum, cleared out any tissue including the ligamentum teres.  After  adequate visualization, I excised the labrum.  I then started reaming with a 50 mm reamer, first medializing to the floor of the cotyloid fossa, and then in the position of the cup aiming towards the greater sciatic notch, matching the version of the transverse acetabular ligament and tucked under the anterior wall. I reamed up to 58 mm reamer with good bony  bed preparation and a 58 mm cup was chosen.  The real cup was then impacted into place.  Appropriate version and inclination was confirmed clinically matching their bony anatomy, and also with the use of the jig.  I placed 2 screws in the posterior superior quadrant to augment fixation.  A neutral liner was placed and impacted. It was confirmed to be appropriately seated and the acetabular retractors were removed.    I then prepared the proximal femur using the box cutter, Charnley awl, and then sequentially broached starting with 0 up to a size 8.  A trial broach, neck, and head was utilized, and I reduced the hip and it was found to have excellent stability.  There was no impingement with full extension and 90 degrees external rotation.  The hip was stable at the position of sleep and with 90 degrees flexion and 75degrees of internal rotation.  Leg lengths were also clinically assessed in the lateral position and felt to be equal. Intra-Op flatplate was obtained and confirmed appropriate component positions.  Good fill of the femur with the size 8 broach.  And restoration of leg length and offset. No evidence or concern for fracture.  A final femoral prosthesis size 8 was selected. I then impacted the real femoral prosthesis into place.I again trialed and selected a 36+ 5mm ball. The hip was then reduced and taken through a range of motion. There was no impingement with full extension and 90 degrees external rotation.  The hip was stable at the position of sleep and with 90 degrees flexion and 80degrees of internal rotation. Leg lengths were  again assessed and felt to be restored.  We then opened, and I impacted the real head ball into place.  The posterior capsule was then closed with #5 Ethibond.  The piriformis was repaired through the base of the abductor tendon using a [place] suture passer.  I then irrigated the hip copiously with dilute Betadine and with normal saline pulse lavage.  Periarticular injection was then performed with Exparel.   We repaired the fascia #1 barbed suture, followed by 0 barbed suture for the subcutaneous fat.  Skin was closed with 2-0 Vicryl and 3-0 Monocryl.  Dermabond and Aquacel dressing were applied. The patient was then awakened and returned to PACU in stable and satisfactory condition.  Leg lengths in the supine position were assessed and felt to be clinically equal. There were no complications.  Post op recs: WB: WBAT LLE, No formal hip precautions Abx: ancef Imaging: PACU pelvis Xray Dressing: Aquacell, keep intact until follow up DVT prophylaxis: Aspirin 81BID starting POD1 Follow up: 2 weeks after surgery for a wound check with Dr. Blanchie Dessert at [facility].  Address: [address], [place], [place] [address]  Office Phone: [phone]   Weber Cooks, MD Orthopedic Surgeon

## 2022-08-24 ENCOUNTER — Encounter (HOSPITAL_COMMUNITY): Payer: Self-pay | Admitting: Orthopedic Surgery

## 2022-08-26 DIAGNOSIS — R269 Unspecified abnormalities of gait and mobility: Secondary | ICD-10-CM | POA: Diagnosis not present

## 2022-08-26 DIAGNOSIS — M1612 Unilateral primary osteoarthritis, left hip: Secondary | ICD-10-CM | POA: Diagnosis not present

## 2022-08-26 DIAGNOSIS — M25652 Stiffness of left hip, not elsewhere classified: Secondary | ICD-10-CM | POA: Diagnosis not present

## 2022-08-26 DIAGNOSIS — M6281 Muscle weakness (generalized): Secondary | ICD-10-CM | POA: Diagnosis not present

## 2022-08-26 DIAGNOSIS — Z96642 Presence of left artificial hip joint: Secondary | ICD-10-CM | POA: Diagnosis not present

## 2022-08-30 DIAGNOSIS — R269 Unspecified abnormalities of gait and mobility: Secondary | ICD-10-CM | POA: Diagnosis not present

## 2022-08-30 DIAGNOSIS — M6281 Muscle weakness (generalized): Secondary | ICD-10-CM | POA: Diagnosis not present

## 2022-08-30 DIAGNOSIS — Z96642 Presence of left artificial hip joint: Secondary | ICD-10-CM | POA: Diagnosis not present

## 2022-08-30 DIAGNOSIS — M25652 Stiffness of left hip, not elsewhere classified: Secondary | ICD-10-CM | POA: Diagnosis not present

## 2022-08-30 DIAGNOSIS — M1612 Unilateral primary osteoarthritis, left hip: Secondary | ICD-10-CM | POA: Diagnosis not present

## 2022-09-06 DIAGNOSIS — M1612 Unilateral primary osteoarthritis, left hip: Secondary | ICD-10-CM | POA: Diagnosis not present

## 2022-09-07 DIAGNOSIS — R269 Unspecified abnormalities of gait and mobility: Secondary | ICD-10-CM | POA: Diagnosis not present

## 2022-09-07 DIAGNOSIS — Z96642 Presence of left artificial hip joint: Secondary | ICD-10-CM | POA: Diagnosis not present

## 2022-09-07 DIAGNOSIS — M25652 Stiffness of left hip, not elsewhere classified: Secondary | ICD-10-CM | POA: Diagnosis not present

## 2022-09-07 DIAGNOSIS — M1612 Unilateral primary osteoarthritis, left hip: Secondary | ICD-10-CM | POA: Diagnosis not present

## 2022-09-07 DIAGNOSIS — M6281 Muscle weakness (generalized): Secondary | ICD-10-CM | POA: Diagnosis not present

## 2022-09-14 DIAGNOSIS — M25652 Stiffness of left hip, not elsewhere classified: Secondary | ICD-10-CM | POA: Diagnosis not present

## 2022-09-14 DIAGNOSIS — Z96642 Presence of left artificial hip joint: Secondary | ICD-10-CM | POA: Diagnosis not present

## 2022-09-14 DIAGNOSIS — M6281 Muscle weakness (generalized): Secondary | ICD-10-CM | POA: Diagnosis not present

## 2022-09-14 DIAGNOSIS — M1612 Unilateral primary osteoarthritis, left hip: Secondary | ICD-10-CM | POA: Diagnosis not present

## 2022-09-14 DIAGNOSIS — R269 Unspecified abnormalities of gait and mobility: Secondary | ICD-10-CM | POA: Diagnosis not present

## 2022-09-22 DIAGNOSIS — R269 Unspecified abnormalities of gait and mobility: Secondary | ICD-10-CM | POA: Diagnosis not present

## 2022-09-22 DIAGNOSIS — M1612 Unilateral primary osteoarthritis, left hip: Secondary | ICD-10-CM | POA: Diagnosis not present

## 2022-09-22 DIAGNOSIS — Z96642 Presence of left artificial hip joint: Secondary | ICD-10-CM | POA: Diagnosis not present

## 2022-09-22 DIAGNOSIS — M6281 Muscle weakness (generalized): Secondary | ICD-10-CM | POA: Diagnosis not present

## 2022-09-22 DIAGNOSIS — M25652 Stiffness of left hip, not elsewhere classified: Secondary | ICD-10-CM | POA: Diagnosis not present

## 2022-09-28 DIAGNOSIS — R269 Unspecified abnormalities of gait and mobility: Secondary | ICD-10-CM | POA: Diagnosis not present

## 2022-09-28 DIAGNOSIS — M6281 Muscle weakness (generalized): Secondary | ICD-10-CM | POA: Diagnosis not present

## 2022-09-28 DIAGNOSIS — M25652 Stiffness of left hip, not elsewhere classified: Secondary | ICD-10-CM | POA: Diagnosis not present

## 2022-09-28 DIAGNOSIS — Z96642 Presence of left artificial hip joint: Secondary | ICD-10-CM | POA: Diagnosis not present

## 2022-09-28 DIAGNOSIS — M1612 Unilateral primary osteoarthritis, left hip: Secondary | ICD-10-CM | POA: Diagnosis not present

## 2022-10-04 DIAGNOSIS — M1612 Unilateral primary osteoarthritis, left hip: Secondary | ICD-10-CM | POA: Diagnosis not present

## 2022-12-15 DIAGNOSIS — M25551 Pain in right hip: Secondary | ICD-10-CM | POA: Diagnosis not present

## 2022-12-15 DIAGNOSIS — M6281 Muscle weakness (generalized): Secondary | ICD-10-CM | POA: Diagnosis not present

## 2022-12-15 DIAGNOSIS — R030 Elevated blood-pressure reading, without diagnosis of hypertension: Secondary | ICD-10-CM | POA: Diagnosis not present

## 2022-12-15 DIAGNOSIS — R5383 Other fatigue: Secondary | ICD-10-CM | POA: Diagnosis not present

## 2022-12-22 DIAGNOSIS — E782 Mixed hyperlipidemia: Secondary | ICD-10-CM | POA: Diagnosis not present

## 2022-12-22 DIAGNOSIS — M1990 Unspecified osteoarthritis, unspecified site: Secondary | ICD-10-CM | POA: Diagnosis not present

## 2022-12-22 DIAGNOSIS — R030 Elevated blood-pressure reading, without diagnosis of hypertension: Secondary | ICD-10-CM | POA: Diagnosis not present

## 2023-03-17 DIAGNOSIS — R030 Elevated blood-pressure reading, without diagnosis of hypertension: Secondary | ICD-10-CM | POA: Diagnosis not present

## 2023-03-17 DIAGNOSIS — M1990 Unspecified osteoarthritis, unspecified site: Secondary | ICD-10-CM | POA: Diagnosis not present

## 2023-03-17 DIAGNOSIS — R5383 Other fatigue: Secondary | ICD-10-CM | POA: Diagnosis not present

## 2023-03-17 DIAGNOSIS — E782 Mixed hyperlipidemia: Secondary | ICD-10-CM | POA: Diagnosis not present

## 2023-03-24 DIAGNOSIS — Z0000 Encounter for general adult medical examination without abnormal findings: Secondary | ICD-10-CM | POA: Diagnosis not present

## 2023-03-24 DIAGNOSIS — R030 Elevated blood-pressure reading, without diagnosis of hypertension: Secondary | ICD-10-CM | POA: Diagnosis not present

## 2023-03-24 DIAGNOSIS — M1990 Unspecified osteoarthritis, unspecified site: Secondary | ICD-10-CM | POA: Diagnosis not present

## 2023-03-24 DIAGNOSIS — Z23 Encounter for immunization: Secondary | ICD-10-CM | POA: Diagnosis not present

## 2023-03-24 DIAGNOSIS — J410 Simple chronic bronchitis: Secondary | ICD-10-CM | POA: Diagnosis not present

## 2023-03-24 DIAGNOSIS — F17200 Nicotine dependence, unspecified, uncomplicated: Secondary | ICD-10-CM | POA: Diagnosis not present

## 2023-03-28 DIAGNOSIS — H6120 Impacted cerumen, unspecified ear: Secondary | ICD-10-CM | POA: Diagnosis not present
